# Patient Record
Sex: Male | Born: 1980 | Race: Black or African American | Hispanic: No | Marital: Married | Smoking: Current every day smoker
Health system: Southern US, Community
[De-identification: ages and names within clinical notes are randomized; demographics above are authoritative.]

## PROBLEM LIST (undated history)

## (undated) HISTORY — PX: HERNIA REPAIR: SHX51

## (undated) HISTORY — PX: TONSILLECTOMY: SUR1361

---
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
## 2000-08-11 ENCOUNTER — Emergency Department (HOSPITAL_COMMUNITY): Admission: EM | Admit: 2000-08-11 | Discharge: 2000-08-11 | Payer: Self-pay | Admitting: Emergency Medicine

## 2001-11-28 ENCOUNTER — Emergency Department (HOSPITAL_COMMUNITY): Admission: EM | Admit: 2001-11-28 | Discharge: 2001-11-28 | Payer: Self-pay | Admitting: Emergency Medicine

## 2003-09-25 ENCOUNTER — Emergency Department (HOSPITAL_COMMUNITY): Admission: EM | Admit: 2003-09-25 | Discharge: 2003-09-25 | Payer: Self-pay | Admitting: Emergency Medicine

## 2003-09-27 ENCOUNTER — Emergency Department (HOSPITAL_COMMUNITY): Admission: EM | Admit: 2003-09-27 | Discharge: 2003-09-27 | Payer: Self-pay | Admitting: *Deleted

## 2004-01-27 ENCOUNTER — Emergency Department (HOSPITAL_COMMUNITY): Admission: EM | Admit: 2004-01-27 | Discharge: 2004-01-27 | Payer: Self-pay | Admitting: Emergency Medicine

## 2004-08-09 ENCOUNTER — Emergency Department (HOSPITAL_COMMUNITY): Admission: EM | Admit: 2004-08-09 | Discharge: 2004-08-09 | Payer: Self-pay | Admitting: Family Medicine

## 2004-08-09 ENCOUNTER — Emergency Department (HOSPITAL_COMMUNITY): Admission: EM | Admit: 2004-08-09 | Discharge: 2004-08-09 | Payer: Self-pay | Admitting: Emergency Medicine

## 2005-08-20 ENCOUNTER — Emergency Department (HOSPITAL_COMMUNITY): Admission: EM | Admit: 2005-08-20 | Discharge: 2005-08-20 | Payer: Self-pay | Admitting: Family Medicine

## 2005-12-09 ENCOUNTER — Emergency Department (HOSPITAL_COMMUNITY): Admission: EM | Admit: 2005-12-09 | Discharge: 2005-12-09 | Payer: Self-pay | Admitting: Emergency Medicine

## 2006-04-04 ENCOUNTER — Emergency Department (HOSPITAL_COMMUNITY): Admission: EM | Admit: 2006-04-04 | Discharge: 2006-04-04 | Payer: Self-pay | Admitting: Family Medicine

## 2006-06-01 ENCOUNTER — Emergency Department (HOSPITAL_COMMUNITY): Admission: EM | Admit: 2006-06-01 | Discharge: 2006-06-01 | Payer: Self-pay | Admitting: Pediatrics

## 2006-06-03 ENCOUNTER — Emergency Department (HOSPITAL_COMMUNITY): Admission: EM | Admit: 2006-06-03 | Discharge: 2006-06-03 | Payer: Self-pay | Admitting: Emergency Medicine

## 2006-06-25 ENCOUNTER — Emergency Department (HOSPITAL_COMMUNITY): Admission: EM | Admit: 2006-06-25 | Discharge: 2006-06-25 | Payer: Self-pay | Admitting: Emergency Medicine

## 2006-08-03 ENCOUNTER — Emergency Department (HOSPITAL_COMMUNITY): Admission: EM | Admit: 2006-08-03 | Discharge: 2006-08-03 | Payer: Self-pay | Admitting: Family Medicine

## 2006-09-30 ENCOUNTER — Emergency Department (HOSPITAL_COMMUNITY): Admission: EM | Admit: 2006-09-30 | Discharge: 2006-09-30 | Payer: Self-pay | Admitting: Family Medicine

## 2006-10-25 ENCOUNTER — Emergency Department (HOSPITAL_COMMUNITY): Admission: EM | Admit: 2006-10-25 | Discharge: 2006-10-25 | Payer: Self-pay | Admitting: Family Medicine

## 2007-03-25 ENCOUNTER — Emergency Department (HOSPITAL_COMMUNITY): Admission: EM | Admit: 2007-03-25 | Discharge: 2007-03-25 | Payer: Self-pay | Admitting: Emergency Medicine

## 2007-04-01 ENCOUNTER — Emergency Department (HOSPITAL_COMMUNITY): Admission: EM | Admit: 2007-04-01 | Discharge: 2007-04-01 | Payer: Self-pay | Admitting: Emergency Medicine

## 2007-04-27 ENCOUNTER — Ambulatory Visit: Payer: Self-pay | Admitting: Family Medicine

## 2007-05-13 ENCOUNTER — Emergency Department (HOSPITAL_COMMUNITY): Admission: EM | Admit: 2007-05-13 | Discharge: 2007-05-14 | Payer: Self-pay | Admitting: Emergency Medicine

## 2007-06-08 ENCOUNTER — Emergency Department (HOSPITAL_COMMUNITY): Admission: EM | Admit: 2007-06-08 | Discharge: 2007-06-08 | Payer: Self-pay | Admitting: Emergency Medicine

## 2007-08-17 ENCOUNTER — Emergency Department (HOSPITAL_COMMUNITY): Admission: EM | Admit: 2007-08-17 | Discharge: 2007-08-17 | Payer: Self-pay | Admitting: Family Medicine

## 2007-12-27 ENCOUNTER — Emergency Department (HOSPITAL_COMMUNITY): Admission: EM | Admit: 2007-12-27 | Discharge: 2007-12-27 | Payer: Self-pay | Admitting: Emergency Medicine

## 2008-01-21 ENCOUNTER — Emergency Department (HOSPITAL_COMMUNITY): Admission: EM | Admit: 2008-01-21 | Discharge: 2008-01-21 | Payer: Self-pay | Admitting: Emergency Medicine

## 2008-05-17 ENCOUNTER — Emergency Department (HOSPITAL_COMMUNITY): Admission: EM | Admit: 2008-05-17 | Discharge: 2008-05-17 | Payer: Self-pay | Admitting: Emergency Medicine

## 2008-05-20 ENCOUNTER — Emergency Department (HOSPITAL_COMMUNITY): Admission: EM | Admit: 2008-05-20 | Discharge: 2008-05-20 | Payer: Self-pay | Admitting: Emergency Medicine

## 2008-05-27 ENCOUNTER — Emergency Department (HOSPITAL_COMMUNITY): Admission: EM | Admit: 2008-05-27 | Discharge: 2008-05-27 | Payer: Self-pay | Admitting: Emergency Medicine

## 2008-08-19 IMAGING — CR DG CHEST 2V
2 series · 2 of 2 positions shown · non-contrast
Comparison: none

HISTORY: Chest pain, anxiety

CHEST 2 VIEWS:
Comparison made to 12/09/2005
Normal heart size, mediastinal contours, and vascularity.
Lungs clear.
No effusion or pneumothorax.
Bones unremarkable.

[w chest pa]
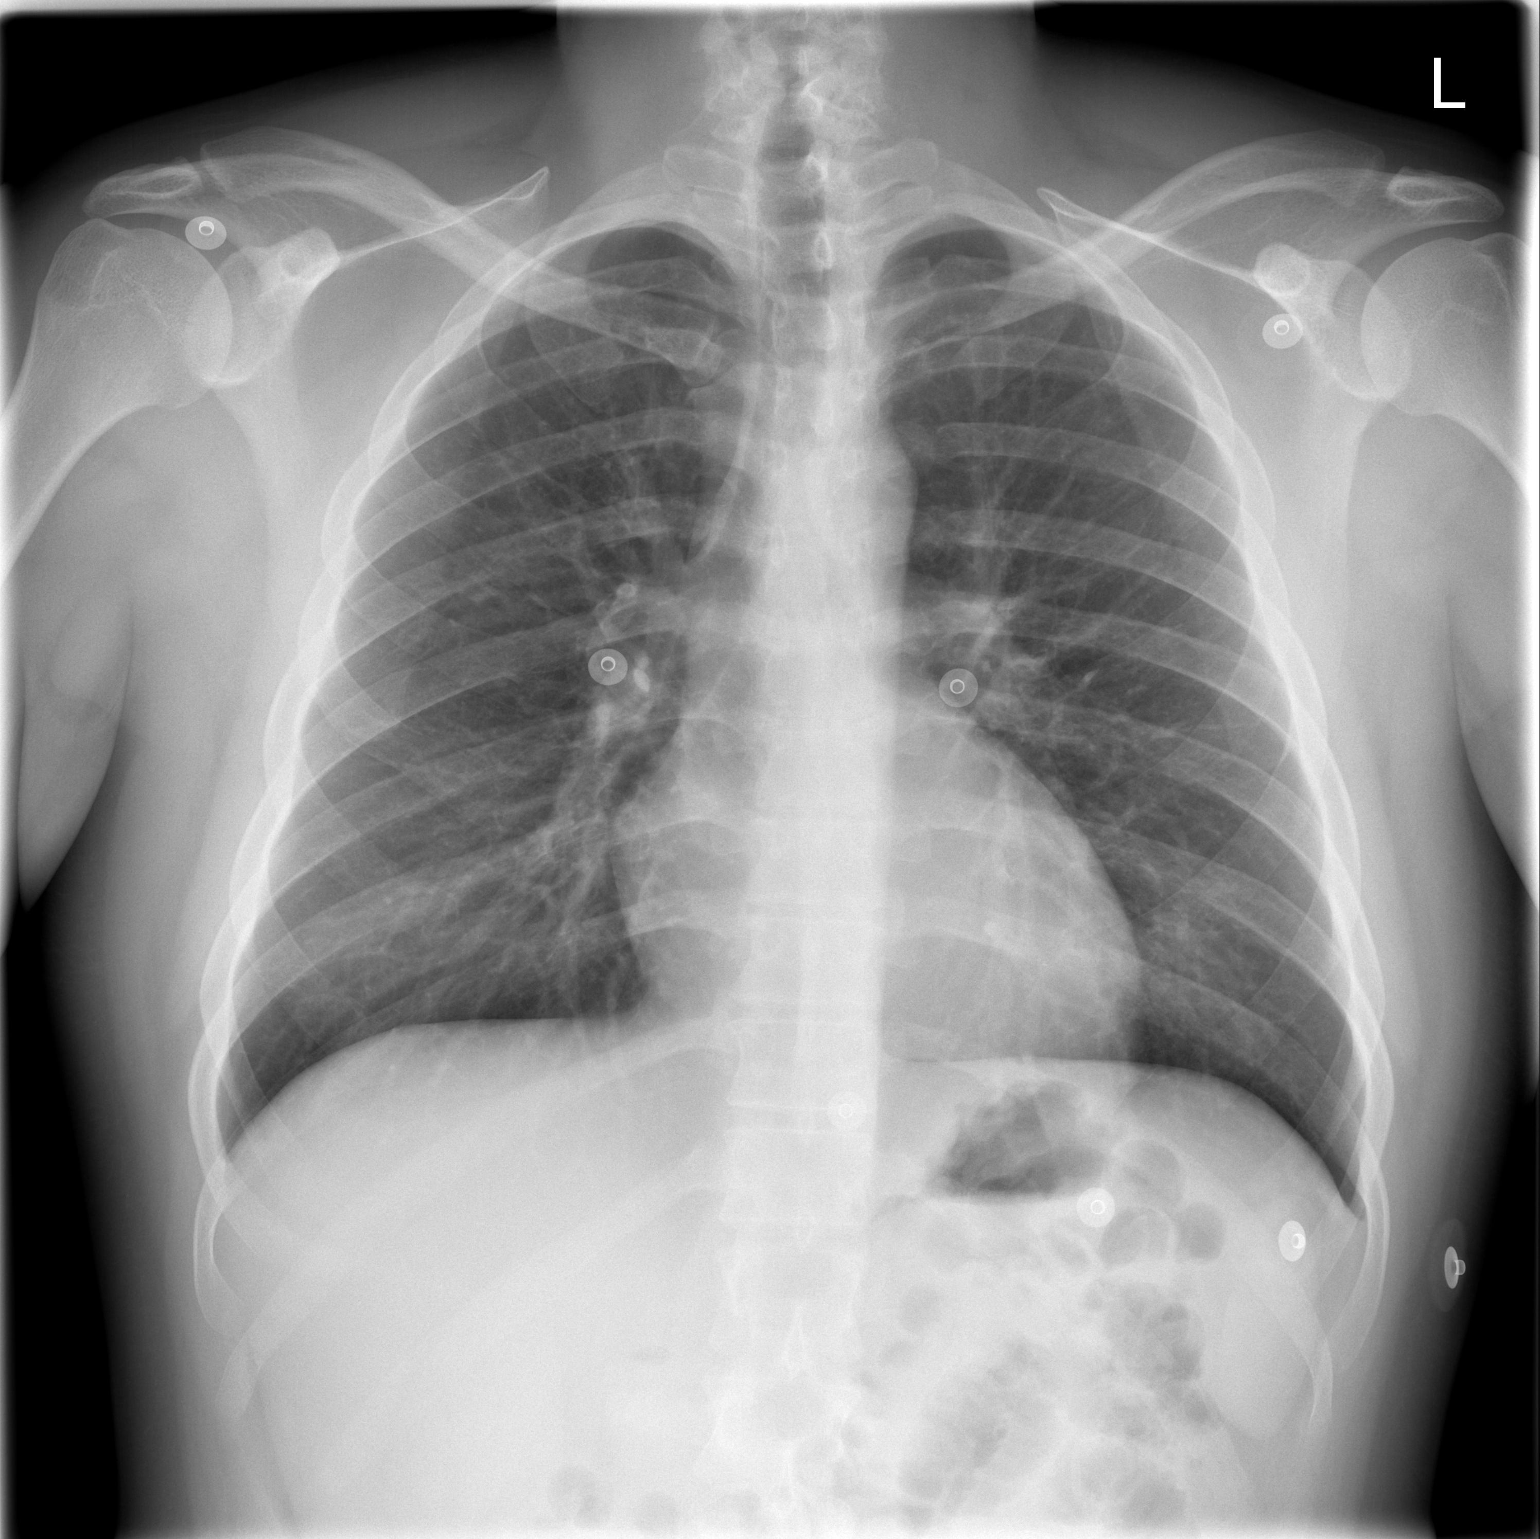

[w chest lat]
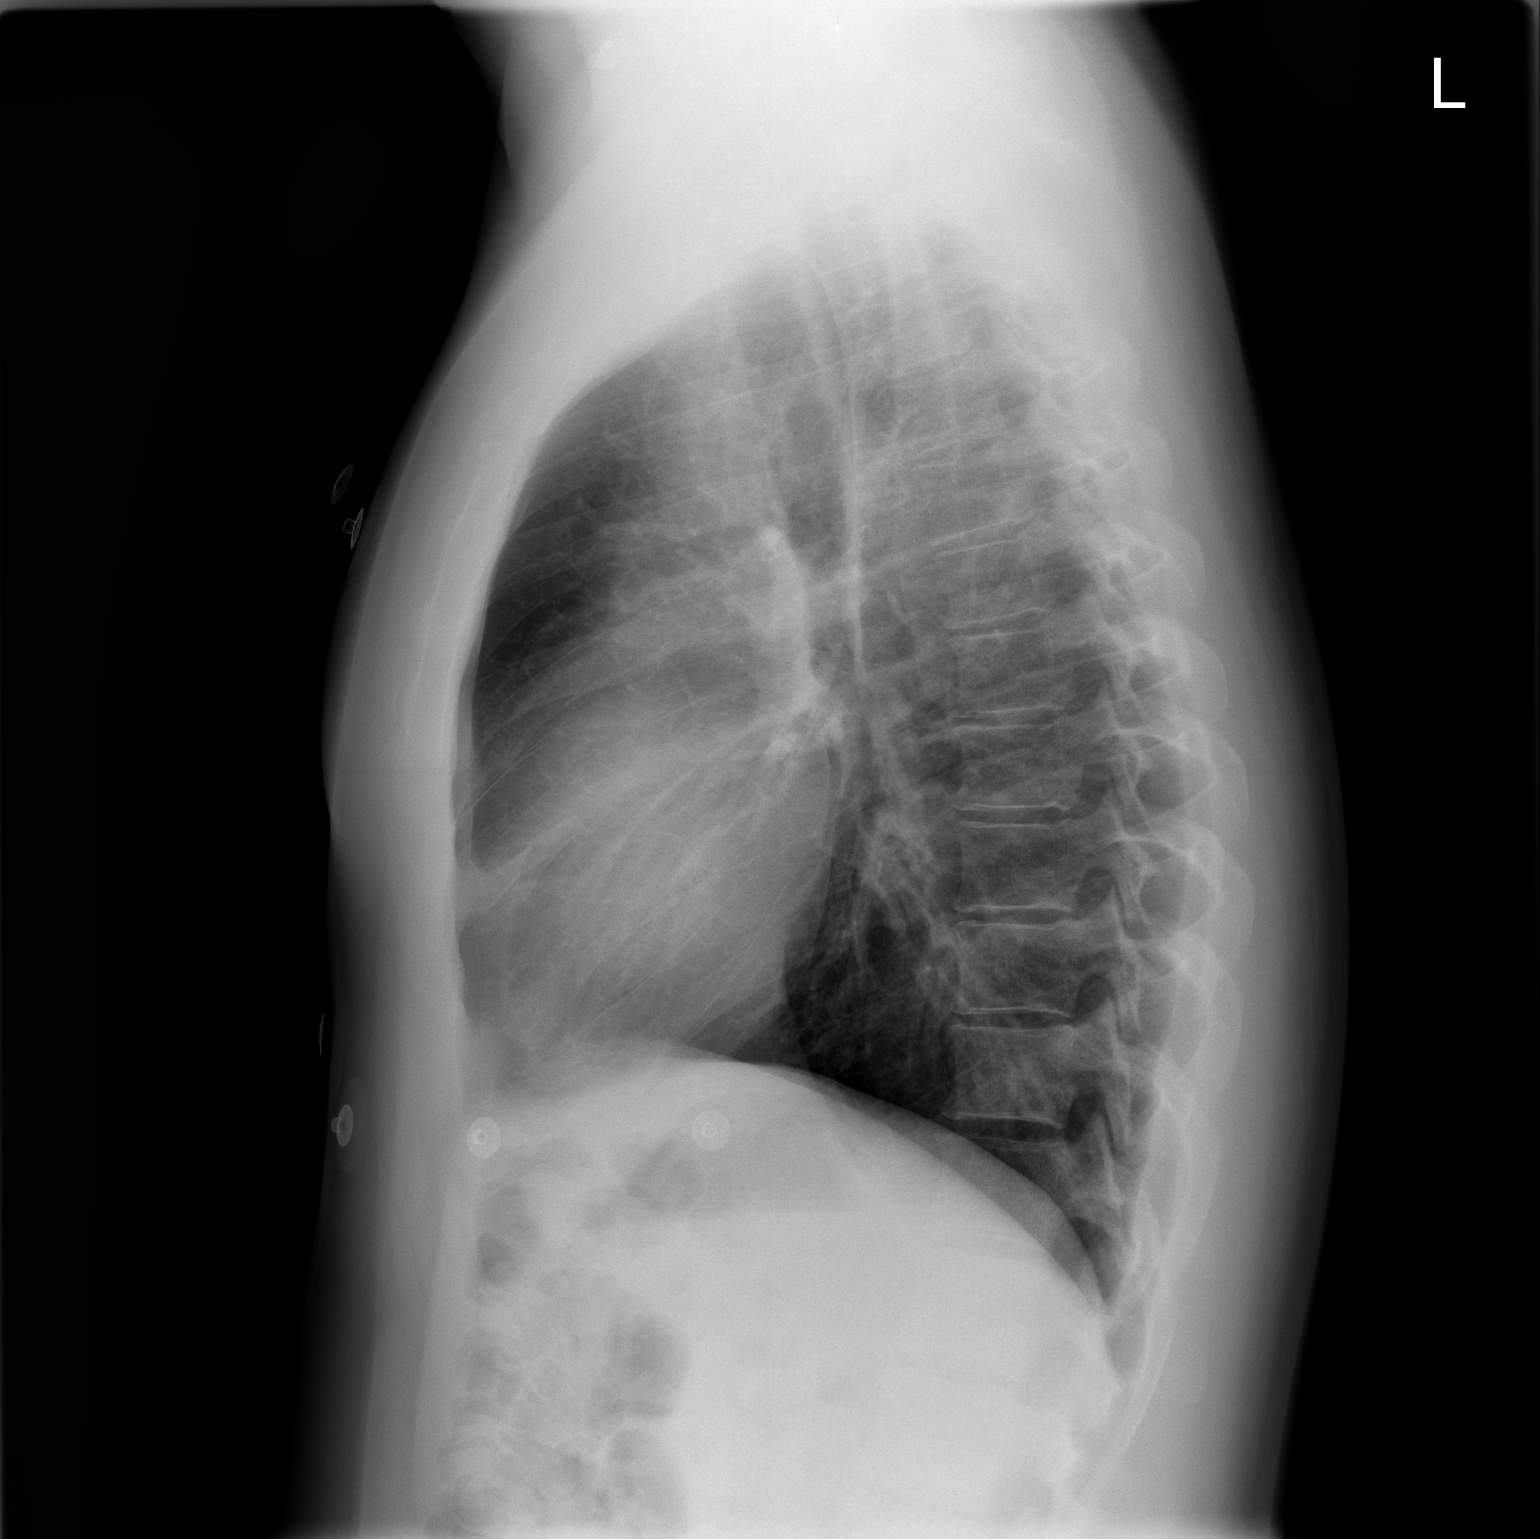

[2 of 2 positions shown; findings below may reference images not displayed]

IMPRESSION: No acute abnormalities.

## 2008-09-15 ENCOUNTER — Emergency Department (HOSPITAL_COMMUNITY): Admission: EM | Admit: 2008-09-15 | Discharge: 2008-09-15 | Payer: Self-pay | Admitting: Emergency Medicine

## 2009-08-23 IMAGING — CR DG CHEST 2V
2 series · 2 of 2 positions shown · non-contrast
Comparison: 06/08/2007

CLINICAL DATA: Vomiting and diarrhea with chest pain and shortness
of breath.

CHEST - 2 VIEW

[w chest pa]
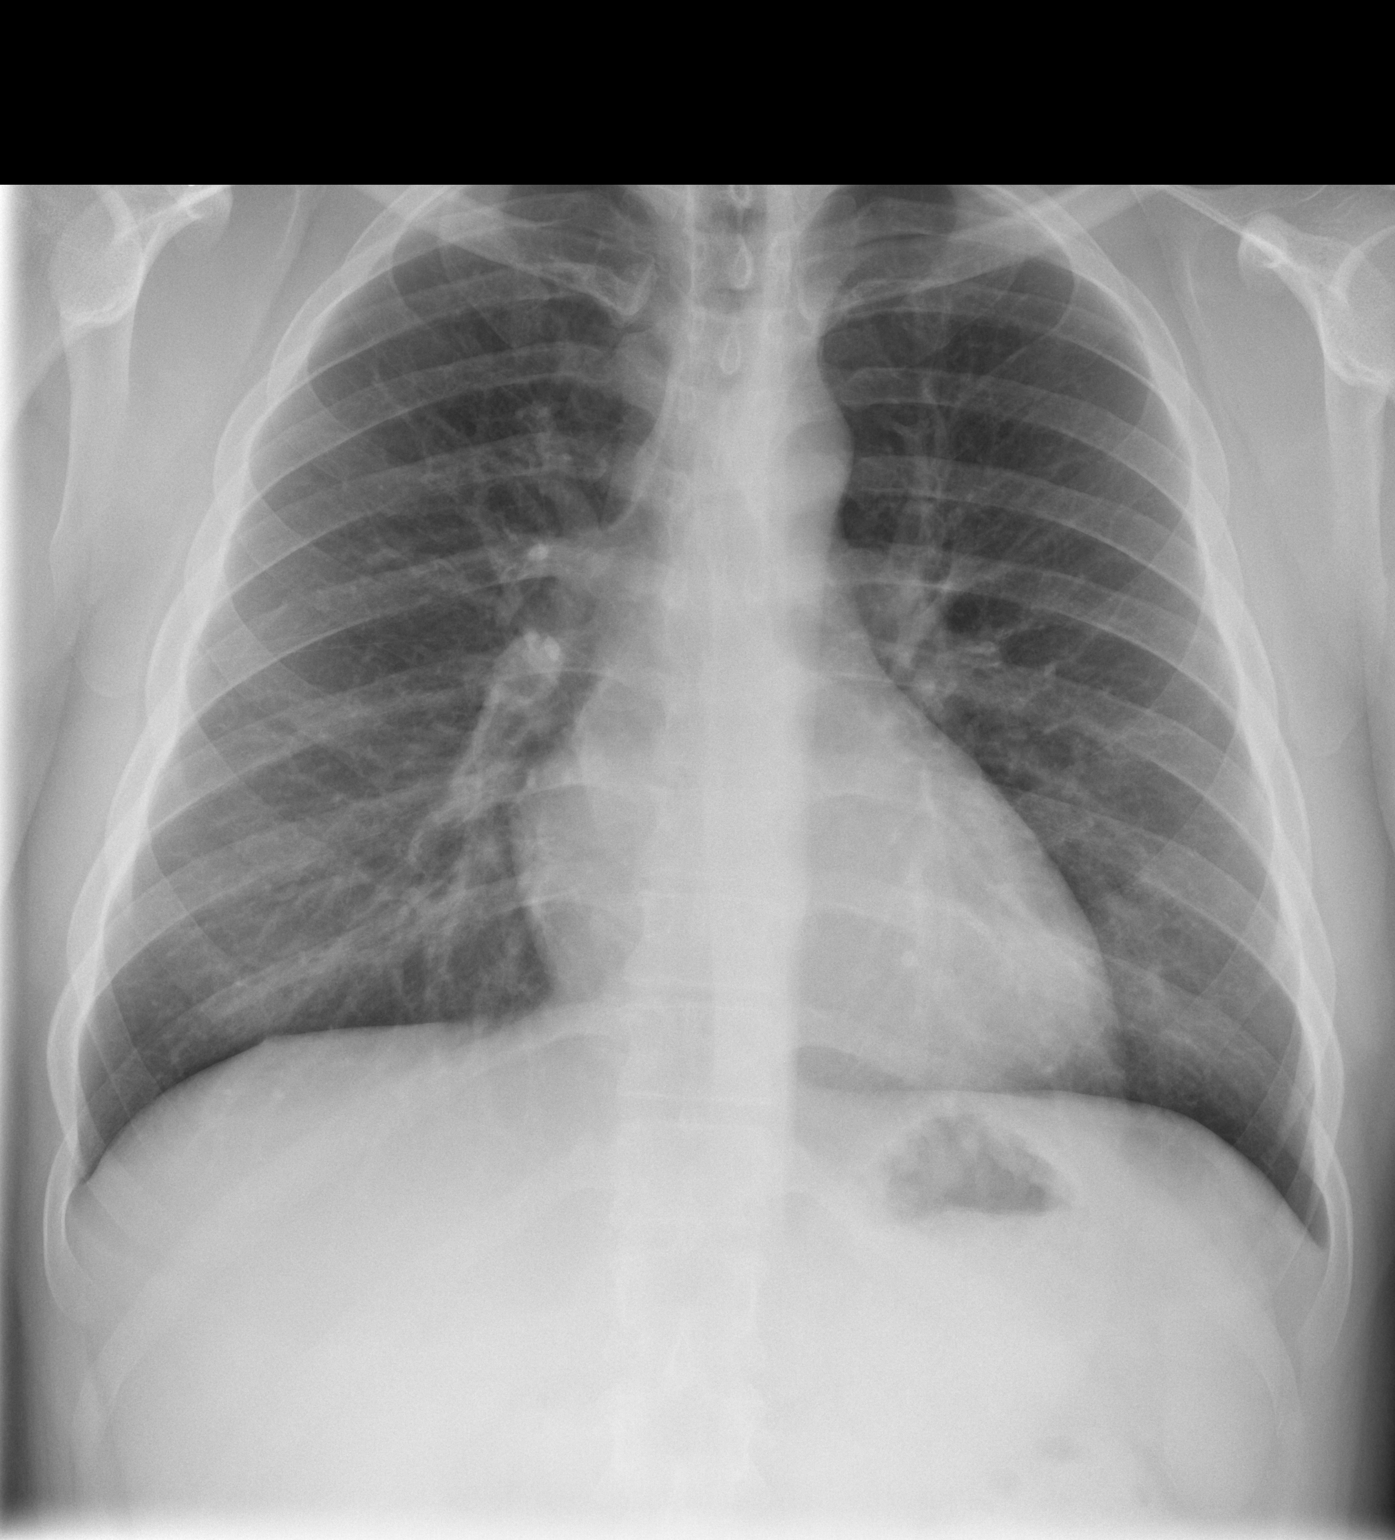

[w chest lat]
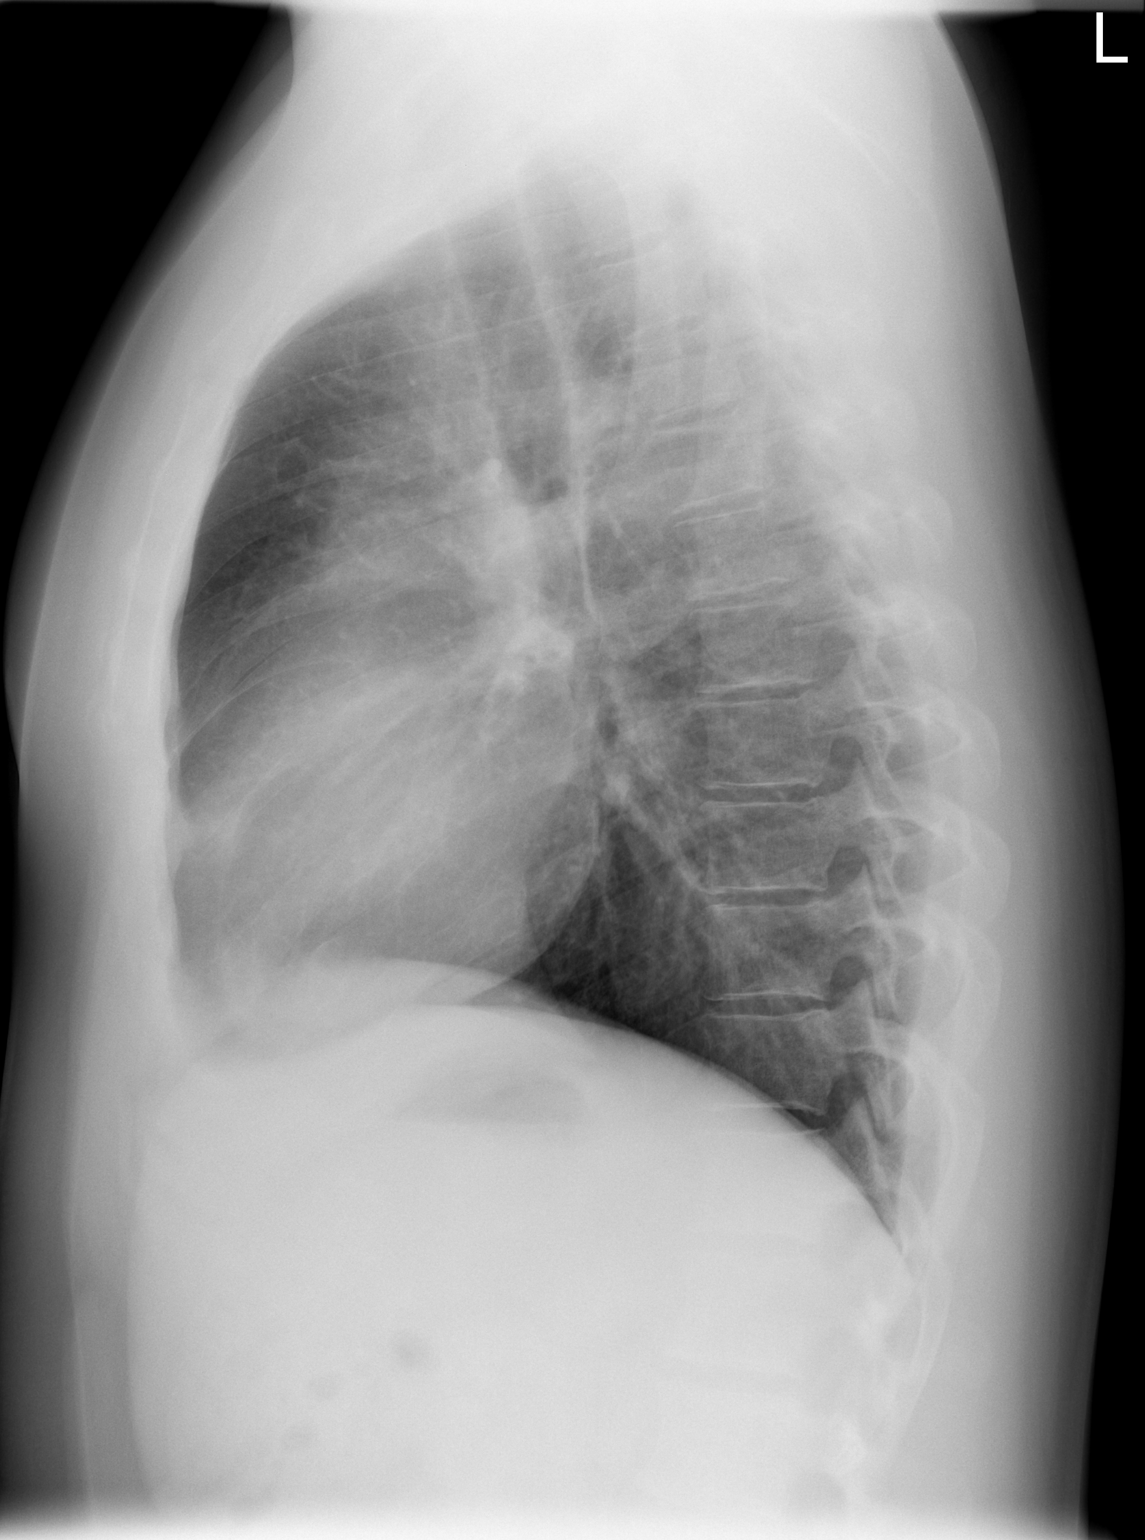

[2 of 2 positions shown; findings below may reference images not displayed]

FINDINGS: Trachea is midline.  Heart size normal.  Lungs are clear.
No pleural fluid.
IMPRESSION: No acute findings.

## 2009-08-26 IMAGING — CR DG CHEST 2V
2 series · 2 of 2 positions shown · non-contrast
Comparison: 05/17/2008

CLINICAL DATA: Abdominal pain

CHEST - 2 VIEW

[w chest pa]
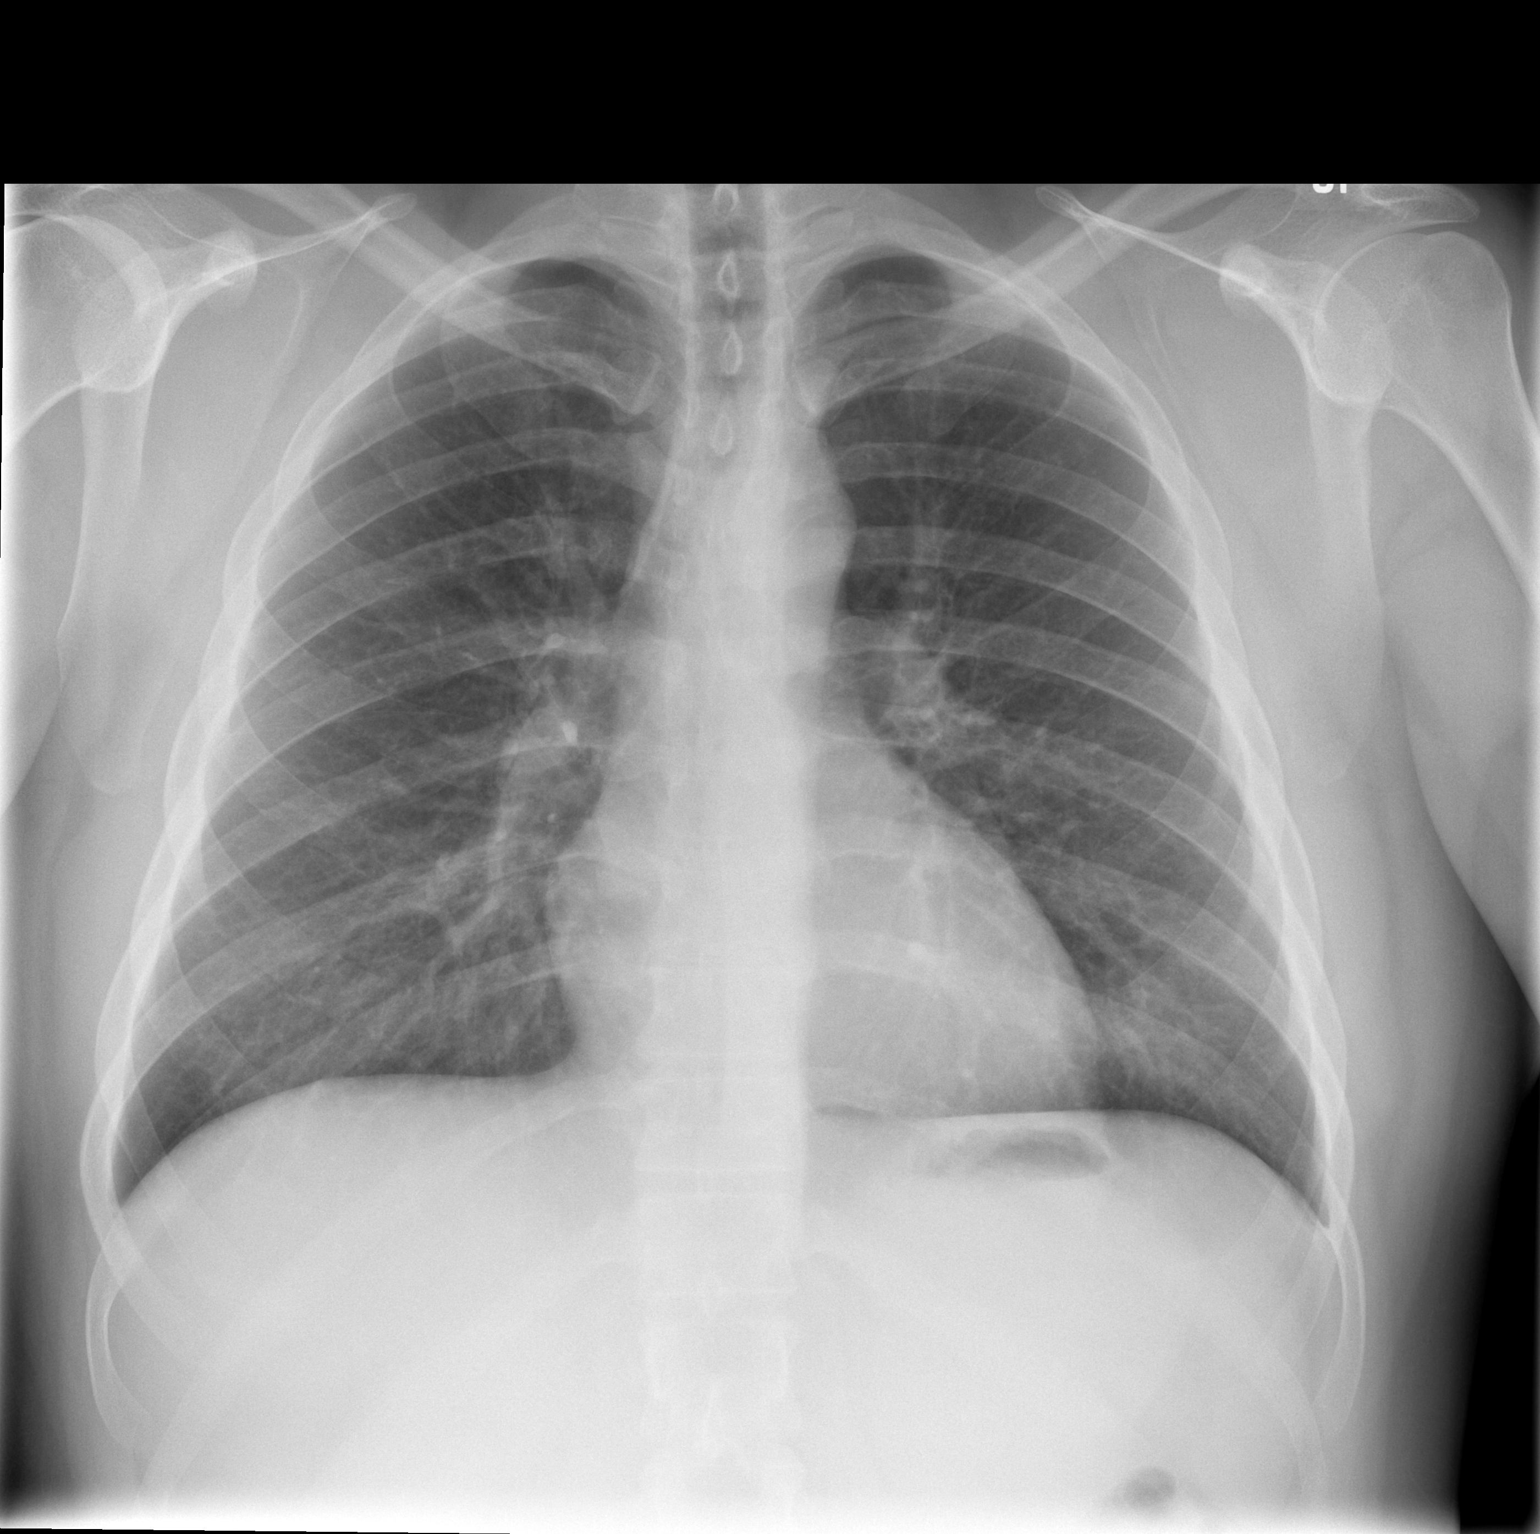

[w chest lat]
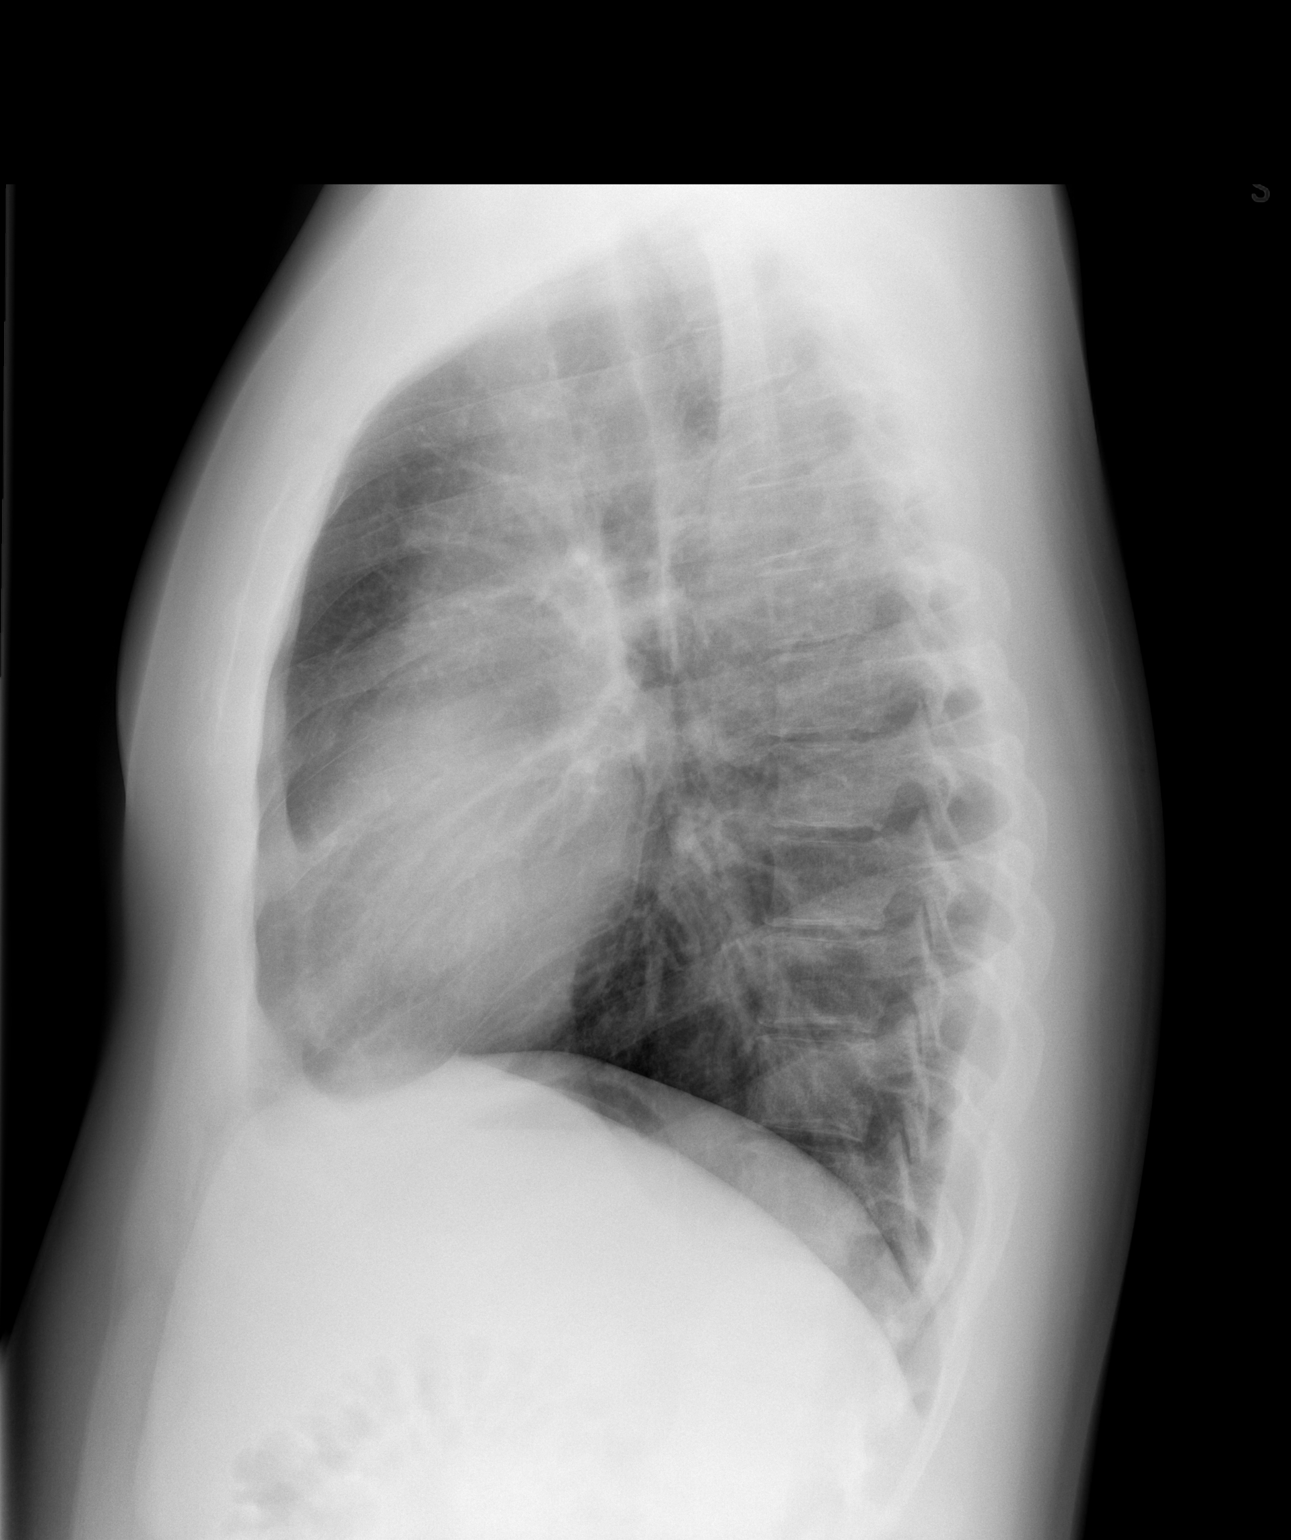

[2 of 2 positions shown; findings below may reference images not displayed]

FINDINGS: The heart size and mediastinal contours are within normal
limits.  Both lungs are clear.
IMPRESSION: No active disease.

## 2010-11-08 ENCOUNTER — Encounter: Payer: Self-pay | Admitting: Sports Medicine

## 2011-07-16 LAB — CBC
HCT: 43.2
HCT: 46.7
Hemoglobin: 14.4
Hemoglobin: 15.6
MCHC: 33.2
MCHC: 33.4
MCV: 85.7
MCV: 86.6
Platelets: 177
Platelets: 256
RBC: 5.05
RBC: 5.39
RDW: 12.5
RDW: 12.5
WBC: 13.9 — ABNORMAL HIGH
WBC: 7.8

## 2011-07-16 LAB — DIFFERENTIAL
Basophils Absolute: 0
Basophils Relative: 0
Basophils Relative: 1
Eosinophils Absolute: 0
Eosinophils Absolute: 0.1
Eosinophils Relative: 0
Lymphocytes Relative: 8 — ABNORMAL LOW
Lymphs Abs: 1.2
Monocytes Absolute: 1.2 — ABNORMAL HIGH
Monocytes Relative: 9
Neutro Abs: 11.5 — ABNORMAL HIGH
Neutro Abs: 3.8
Neutrophils Relative %: 83 — ABNORMAL HIGH
Neutrophils Relative %: 49

## 2011-07-16 LAB — POCT I-STAT, CHEM 8
BUN: 14
Calcium, Ion: 1 — ABNORMAL LOW
Calcium, Ion: 1.25
Chloride: 108
Chloride: 105
Creatinine, Ser: 1
Glucose, Bld: 112 — ABNORMAL HIGH
Glucose, Bld: 89
HCT: 44
HCT: 48
Hemoglobin: 15
Hemoglobin: 16.3
Potassium: 4
Sodium: 138
TCO2: 21
TCO2: 29

## 2011-07-16 LAB — URINALYSIS, ROUTINE W REFLEX MICROSCOPIC
Bilirubin Urine: NEGATIVE
Glucose, UA: NEGATIVE
Hgb urine dipstick: NEGATIVE
Ketones, ur: NEGATIVE
Nitrite: NEGATIVE
Nitrite: NEGATIVE
Protein, ur: NEGATIVE
Specific Gravity, Urine: 1.028
Specific Gravity, Urine: 1.028
Urobilinogen, UA: 1
pH: 5.5
pH: 5.5

## 2011-07-16 LAB — URINE MICROSCOPIC-ADD ON

## 2011-07-16 LAB — LIPASE, BLOOD: Lipase: 23

## 2011-07-16 LAB — HEPATIC FUNCTION PANEL
AST: 21
Albumin: 4.2
Total Protein: 8.3

## 2011-07-20 LAB — DIFFERENTIAL
Basophils Absolute: 0
Eosinophils Relative: 1
Lymphocytes Relative: 29
Monocytes Absolute: 0.7
Monocytes Relative: 12
Neutro Abs: 3.3

## 2011-07-20 LAB — BASIC METABOLIC PANEL
BUN: 12
Chloride: 106
Creatinine, Ser: 0.91

## 2011-07-20 LAB — CBC
MCHC: 35
MCV: 83.7
Platelets: 215
WBC: 5.7

## 2011-08-02 LAB — CBC
HCT: 41.2
MCHC: 34.3
MCV: 83.2
Platelets: 215
RDW: 12.9
WBC: 6.5

## 2011-08-02 LAB — DIFFERENTIAL
Basophils Relative: 1
Eosinophils Absolute: 0
Eosinophils Relative: 1
Lymphs Abs: 2.6
Neutrophils Relative %: 46

## 2011-08-02 LAB — I-STAT 8, (EC8 V) (CONVERTED LAB)
Acid-base deficit: 2
Bicarbonate: 21.7
HCT: 46
Operator id: 192351
TCO2: 23
pCO2, Ven: 32.1 — ABNORMAL LOW
pH, Ven: 7.436 — ABNORMAL HIGH

## 2011-08-02 LAB — POCT I-STAT CREATININE
Creatinine, Ser: 0.8
Operator id: 192351

## 2011-08-02 LAB — POCT CARDIAC MARKERS
CKMB, poc: <1 — ABNORMAL LOW
Myoglobin, poc: 43.5
Myoglobin, poc: 60.2
Operator id: 192351
Troponin i, poc: <0.05

## 2014-01-16 ENCOUNTER — Emergency Department (HOSPITAL_COMMUNITY): Payer: Self-pay

## 2014-01-16 ENCOUNTER — Emergency Department (HOSPITAL_COMMUNITY)
Admission: EM | Admit: 2014-01-16 | Discharge: 2014-01-16 | Disposition: A | Discharge status: Home / Self Care | Payer: Self-pay | Attending: Emergency Medicine | Admitting: Emergency Medicine

## 2014-01-16 ENCOUNTER — Encounter (HOSPITAL_COMMUNITY): Payer: Self-pay | Admitting: Emergency Medicine

## 2014-01-16 DIAGNOSIS — J45901 Unspecified asthma with (acute) exacerbation: Secondary | ICD-10-CM | POA: Insufficient documentation

## 2014-01-16 DIAGNOSIS — F17200 Nicotine dependence, unspecified, uncomplicated: Secondary | ICD-10-CM | POA: Insufficient documentation

## 2014-01-16 DIAGNOSIS — J9801 Acute bronchospasm: Secondary | ICD-10-CM

## 2014-01-16 DIAGNOSIS — J40 Bronchitis, not specified as acute or chronic: Secondary | ICD-10-CM

## 2014-01-16 DIAGNOSIS — R1110 Vomiting, unspecified: Secondary | ICD-10-CM | POA: Insufficient documentation

## 2014-01-16 DIAGNOSIS — R1013 Epigastric pain: Secondary | ICD-10-CM | POA: Insufficient documentation

## 2014-01-16 LAB — CBC
HCT: 41.7 % (ref 39.0–52.0)
HEMOGLOBIN: 14 g/dL (ref 13.0–17.0)
MCH: 27.9 pg (ref 26.0–34.0)
MCHC: 33.6 g/dL (ref 30.0–36.0)
MCV: 83.2 fL (ref 78.0–100.0)
PLATELETS: 234 10*3/uL (ref 150–400)
RBC: 5.01 MIL/uL (ref 4.22–5.81)
RDW: 13.4 % (ref 11.5–15.5)
WBC: 6.9 10*3/uL (ref 4.0–10.5)

## 2014-01-16 LAB — COMPREHENSIVE METABOLIC PANEL
ALK PHOS: 76 U/L (ref 39–117)
ALT: 24 U/L (ref 0–53)
AST: 21 U/L (ref 0–37)
Albumin: 4 g/dL (ref 3.5–5.2)
BUN: 13 mg/dL (ref 6–23)
CO2: 22 mEq/L (ref 19–32)
Calcium: 9.4 mg/dL (ref 8.4–10.5)
Chloride: 103 mEq/L (ref 96–112)
Creatinine, Ser: 0.66 mg/dL (ref 0.50–1.35)
GFR calc non Af Amer: >90 mL/min (ref >90)
GLUCOSE: 97 mg/dL (ref 70–99)
POTASSIUM: 4 meq/L (ref 3.7–5.3)
SODIUM: 138 meq/L (ref 137–147)
TOTAL PROTEIN: 8 g/dL (ref 6.0–8.3)
Total Bilirubin: 0.4 mg/dL (ref 0.3–1.2)

## 2014-01-16 LAB — I-STAT TROPONIN, ED: Troponin i, poc: 0 ng/mL (ref 0.00–0.08)

## 2014-01-16 MED ORDER — PREDNISONE 20 MG PO TABS
60.0000 mg | ORAL_TABLET | Freq: Once | ORAL | Status: AC
  Administered 2014-01-16: 60 mg via ORAL
  Filled 2014-01-16: qty 3

## 2014-01-16 MED ORDER — ALBUTEROL SULFATE HFA 108 (90 BASE) MCG/ACT IN AERS
2.0000 | INHALATION_SPRAY | Freq: Once | RESPIRATORY_TRACT | Status: AC
  Administered 2014-01-16: 2 via RESPIRATORY_TRACT
  Filled 2014-01-16: qty 6.7

## 2014-01-16 MED ORDER — PREDNISONE 50 MG PO TABS: ORAL_TABLET | ORAL | Status: DC

## 2014-01-16 MED ORDER — IPRATROPIUM BROMIDE 0.02 % IN SOLN
0.5000 mg | Freq: Once | RESPIRATORY_TRACT | Status: AC
  Administered 2014-01-16: 0.5 mg via RESPIRATORY_TRACT
  Filled 2014-01-16: qty 2.5

## 2014-01-16 MED ORDER — ALBUTEROL SULFATE (2.5 MG/3ML) 0.083% IN NEBU
5.0000 mg | INHALATION_SOLUTION | Freq: Once | RESPIRATORY_TRACT | Status: AC
  Administered 2014-01-16: 5 mg via RESPIRATORY_TRACT
  Filled 2014-01-16: qty 6

## 2014-01-16 NOTE — Discharge Instructions (Signed)
Take prednisone as prescribed for the next 5 days beginning tomorrow as you were given the first dose in the emergency department today. Follow up with the Wellness clinic or one of the resources below to establish care with a primary care doctor.  RESOURCE GUIDE  Chronic Pain Problems: Contact [facility]  [phone] Patients need to be referred by their primary care doctor.  Insufficient Money for Medicine: Contact [employer]:  call "211."   No Primary Care Doctor: - Call [facility]  [phone] - can help you locate a primary care doctor that  accepts your insurance, provides certain services, etc. - Physician Referral Service- [phone]  Agencies that provide inexpensive medical care: - [facility]  [phone] - [facility]  [phone] - [facility]  [phone] - [facility] Clinic  [phone] - [facility]  [phone] - [facility]  [phone]  Medicaid-accepting [facility] Providers: - [facility]- [address]  [phone], Mon-Fri 9am-7pm, Sat 9am-1pm - [facility]- [address] [place], [address] [place]  [phone] - [facility]- [address], [address] [place]  [phone] [facility]- [address]  [phone] - Renaye Rakers- [address] [place], [address], [phone]  Only accepts [place] patients after they have their name  applied to their card  Self Pay (no insurance) in [place]: - Sickle Cell Patients: Dr Willey Blade, [facility]  [address] [place], [phone] - [facility]- [address] [place]  [phone]       [facility] [place]- [address]       -     [facility]- see information above (Speak to [employer] if you do not have insurance)       -  [facility]- [address],  [phone]       -  [facility]- [address], [phone]       -  Dr Julio Sicks-  [address], [place], [phone]       -  [facility] - [address], [phone]       -  [facility]- [address], [phone], also [address], [phone]       -    [facility]- [address] [place], [phone], 1st & 3rd Saturday        every month, 10am-1pm  1) Find a Doctor and Pay Out of Pocket Although you won't have to find out who is covered by your insurance plan, it is a good idea to ask around and get recommendations. You will then need to call the office and see if the doctor you have chosen will accept you as a new patient and what types of options they offer for patients who are self-pay. Some doctors offer discounts or will set up payment plans for their patients who do not have insurance, but you will need to ask so you aren't surprised when you get to your appointment.  2) Contact Your Local Health Department Not all health departments have doctors that can see patients for sick visits, but many do, so it is worth a call to see if yours does. If you don't know where your local  health department is, you can check in your phone book. The CDC also has a tool to help you locate your state's health department, and many state websites also have listings of all of their local health departments.  3) Find a Walk-in Clinic If your illness is not likely to be very severe or complicated, you may want to try a walk in clinic. These are popping up all over the country in pharmacies, drugstores, and shopping centers. They're usually staffed by nurse practitioners or physician assistants that have been trained to treat common illnesses and complaints. They're usually fairly quick and inexpensive. However, if you have serious medical issues or chronic medical problems, these are probably not your best option  Bronchitis Bronchitis is inflammation of  the airways that extend from the windpipe into the lungs (bronchi). The inflammation often causes mucus to develop, which leads to a cough. If the inflammation becomes severe, it may cause shortness of breath. CAUSES  Bronchitis may be caused by:   Viral infections.   Bacteria.   Cigarette smoke.   Allergens, pollutants, and other irritants.  SIGNS AND SYMPTOMS  The most common symptom of bronchitis is a frequent cough that produces mucus. Other symptoms include:  Fever.   Body aches.   Chest congestion.   Chills.   Shortness of breath.   Sore throat.  DIAGNOSIS  Bronchitis is usually diagnosed through a medical history and physical exam. Tests, such as chest X-rays, are sometimes done to rule out other conditions.  TREATMENT  You may need to avoid contact with whatever caused the problem (smoking, for example). Medicines are sometimes needed. These may include:  Antibiotics. These may be prescribed if the condition is caused by bacteria.  Cough suppressants. These may be prescribed for relief of cough symptoms.   Inhaled medicines. These may be prescribed to help open your airways and make it easier for you to breathe.   Steroid medicines. These may be prescribed for those with recurrent (chronic) bronchitis. HOME CARE INSTRUCTIONS  Get plenty of rest.   Drink enough fluids to keep your urine clear or pale yellow (unless you have a medical condition that requires fluid restriction). Increasing fluids may help thin your secretions and will prevent dehydration.   Only take over-the-counter or prescription medicines as directed by your health care provider.  Only take antibiotics as directed. Make sure you finish them even if you start to feel better.  Avoid secondhand smoke, irritating chemicals, and strong fumes. These will make bronchitis worse. If you are a smoker, quit smoking. Consider using nicotine gum or skin patches to help control withdrawal  symptoms. Quitting smoking will help your lungs heal faster.   Put a cool-mist humidifier in your bedroom at night to moisten the air. This may help loosen mucus. Change the water in the humidifier daily. You can also run the hot water in your shower and sit in the bathroom with the door closed for 5 10 minutes.   Follow up with your health care provider as directed.   Wash your hands frequently to avoid catching bronchitis again or spreading an infection to others.  SEEK MEDICAL CARE IF: Your symptoms do not improve after 1 week of treatment.  SEEK IMMEDIATE MEDICAL CARE IF:  Your fever increases.  You have chills.   You have chest pain.   You have worsening shortness of breath.   You have bloody sputum.  You faint.  You have lightheadedness.  You have a severe headache.  You vomit repeatedly. MAKE SURE YOU:   Understand these instructions.  Will watch your condition.  Will get help right away if you are not doing well or get worse. Document Released: 10/04/2005 Document Revised: 07/25/2013 Document Reviewed: 05/29/2013 [facility] Patient Information 2014 [place].  Bronchospasm, Adult A bronchospasm is a spasm or tightening of the airways going into the lungs. During a bronchospasm breathing becomes more difficult because the airways get smaller. When this happens there can be coughing, a whistling sound when breathing (wheezing), and difficulty breathing. Bronchospasm is often associated with asthma, but not all patients who experience a bronchospasm have asthma. CAUSES  A bronchospasm is caused by inflammation or irritation of the airways. The inflammation or irritation may be triggered by:   Allergies (such as to animals, pollen, food, or mold). Allergens that cause bronchospasm may cause wheezing immediately after exposure or many hours later.   Infection. Viral infections are believed to be the most common cause of bronchospasm.   Exercise.    Irritants (such as pollution, cigarette smoke, strong odors, aerosol sprays, and paint fumes).   Weather changes. Winds increase molds and pollens in the air. Rain refreshes the air by washing irritants out. Cold air may cause inflammation.   Stress and emotional upset.  SIGNS AND SYMPTOMS   Wheezing.   Excessive nighttime coughing.   Frequent or severe coughing with a simple cold.   Chest tightness.   Shortness of breath.  DIAGNOSIS  Bronchospasm is usually diagnosed through a history and physical exam. Tests, such as chest X-rays, are sometimes done to look for other conditions. TREATMENT   Inhaled medicines can be given to open up your airways and help you breathe. The medicines can be given using either an inhaler or a nebulizer machine.  Corticosteroid medicines may be given for severe bronchospasm, usually when it is associated with asthma. HOME CARE INSTRUCTIONS   Always have a plan prepared for seeking medical care. Know when to call your health care provider and local emergency services (911 in the [place] Know where you can access local emergency care.  Only take medicines as directed by your health care provider.  If you were prescribed an inhaler or nebulizer machine, ask your health care provider to explain how to use it correctly. Always use a spacer with your inhaler if you were given one.  It is necessary to remain calm during an attack. Try to relax and breathe more slowly.  Control your home environment in the following ways:   Change your heating and air conditioning filter at least once a month.   Limit your use of fireplaces and wood stoves.  Do not smoke and do not allow smoking in your home.   Avoid exposure to perfumes and fragrances.   Get rid of pests (such as roaches and mice) and their droppings.   Throw away plants if you see mold on them.   Keep your house clean and dust free.   Replace carpet with wood, tile, or vinyl  flooring. Carpet can trap dander and dust.   Use allergy-proof pillows, mattress covers, and box spring covers.   Wash bed sheets and blankets every week in hot water and dry them in a dryer.   Use blankets that are made of polyester or cotton.   Wash hands frequently. SEEK MEDICAL CARE IF:   You have muscle aches.   You have chest pain.   The sputum changes from clear or white to yellow, green, gray, or  bloody.   The sputum you cough up gets thicker.   There are problems that may be related to the medicine you are given, such as a rash, itching, swelling, or trouble breathing.  SEEK IMMEDIATE MEDICAL CARE IF:   You have worsening wheezing and coughing even after taking your prescribed medicines.   You have increased difficulty breathing.   You develop severe chest pain. MAKE SURE YOU:   Understand these instructions.  Will watch your condition.  Will get help right away if you are not doing well or get worse. Document Released: 10/07/2003 Document Revised: 06/06/2013 Document Reviewed: 03/26/2013 [facility] Patient Information 2014 [place], [place].

## 2014-01-16 NOTE — ED Provider Notes (Signed)
CSN: [id number]     Arrival date & time 01/16/14  1238 History   First MD Initiated Contact with Patient 01/16/14 1255     Chief Complaint  Patient presents with  . Pleurisy     (Consider location/radiation/quality/duration/timing/severity/associated sxs/prior Treatment) HPI Comments: 33 y/o male with no significant PMHx presents to the ED with his wife complaining of chest pain/tightness, cough and shortness of breath x 3 days. Pt states he has had these symptoms "for a while, a few months" worsening 3 days ago. Pain located in the center of his chest described as a tightness, worse with palpation of chest or deep inspiration. Admits to associated vomiting causing pain to his upper abdomen. Shortness of breath is both at rest and on exertion. Wife states at night he will cough and wake up short of breath, and when she lays on his chest she can hear "rattling". Had asthma as a child. Smokes 1 ppd. Denies edema. No hx of blood clots.   The history is provided by the patient and the spouse.    History reviewed. No pertinent past medical history. Past Surgical History  Procedure Laterality Date  . Hernia repair    . Tonsillectomy     History reviewed. No pertinent family history. History  Substance Use Topics  . Smoking status: Current Every Day Smoker -- 1.00 packs/day    Types: Cigarettes  . Smokeless tobacco: Not on file  . Alcohol Use: Yes     Comment: social    Review of Systems  Respiratory: Positive for cough, chest tightness, shortness of breath and wheezing.   Cardiovascular: Positive for chest pain.  Gastrointestinal: Positive for vomiting and abdominal pain.  All other systems reviewed and are negative.      Allergies  Review of patient's allergies indicates no known allergies.  Home Medications   Current Outpatient Rx  Name  Route  Sig  Dispense  Refill  . Acetaminophen (TYLENOL PO)   Oral   Take 1 tablet by mouth daily as needed (pain.).         Marland Kitchen  ibuprofen (ADVIL,MOTRIN) 200 MG tablet   Oral   Take 200 mg by mouth every 6 (six) hours as needed for mild pain.         . Sennosides (EX-LAX PO)   Oral   Take by mouth as needed (constipation.).         Marland Kitchen predniSONE (DELTASONE) 50 MG tablet      2 tabs po daily x 3 days   5 tablet   0    BP 116/75  Pulse 81  Temp(Src) 97.8 F (36.6 C) (Oral)  Resp 19  SpO2 94% Physical Exam  Nursing note and vitals reviewed. Constitutional: He is oriented to person, place, and time. He appears well-developed and well-nourished. No distress.  HENT:  Head: Normocephalic and atraumatic.  Mouth/Throat: Oropharynx is clear and moist.  Eyes: Conjunctivae and EOM are normal. Pupils are equal, round, and reactive to light.  Neck: Normal range of motion. Neck supple. No JVD present.  Cardiovascular: Normal rate, regular rhythm, normal heart sounds and intact distal pulses.   No extremity edema.  Pulmonary/Chest: Effort normal. No respiratory distress. He exhibits tenderness.    Scattered inspiratory/expiratory wheezes and ronchi bilatera.  Abdominal: Soft. Normal appearance and bowel sounds are normal. He exhibits no distension. There is tenderness in the epigastric area. There is no rigidity, no rebound and no guarding.  Musculoskeletal: Normal range of motion. He exhibits no  edema.  Neurological: He is alert and oriented to person, place, and time. He has normal strength. No sensory deficit.  Speech fluent, goal oriented. Moves limbs without ataxia. Equal grip strength bilateral.  Skin: Skin is warm and dry. He is not diaphoretic.  Psychiatric: He has a normal mood and affect. His behavior is normal.    ED Course  Procedures (including critical care time) Labs Review Labs Reviewed  CBC  COMPREHENSIVE METABOLIC PANEL  Rosezena SensorI-STAT TROPOININ, ED   Imaging Review Dg Chest 2 View  01/16/2014   CLINICAL DATA:  Shortness of breath/pleurisy  EXAM: CHEST  2 VIEW  COMPARISON:  May 20, 2008   FINDINGS: Lungs are clear. Heart size and pulmonary vascularity are normal. No adenopathy. No pneumothorax. No bone lesions.  IMPRESSION: No abnormality noted.   Electronically Signed   By: Bretta BangWilliam  Woodruff M.D.   On: 01/16/2014 13:54     EKG Interpretation   Date/Time:  Wednesday January 16 2014 12:49:05 EDT Ventricular Rate:  78 PR Interval:  188 QRS Duration: 114 QT Interval:  391 QTC Calculation: 445 R Axis:   83 Text Interpretation:  Sinus rhythm Borderline intraventricular conduction  delay No significant change since last tracing Confirmed by KNAPP  MD-J,  JON ([id number]) on 01/16/2014 1:07:51 PM      MDM   Final diagnoses:  Bronchitis  Bronchospasm   Presentation of acute asthma/bronchitis. Pt well appearing and in NAD, afebrile, normal VS. Harsh cough present, lungs with significant ronchi and wheezes throughout. CXR, labs pending. Doubt cardiac, low risk other than smoking. PERC negative, low suspicion for PE. Will give DuoNeb and prednisone. 2:36 PM CXR clear. Labs normal. Troponin negative. EKG without significant changes. Pt reports great improvement after prednisone and DuoNeb. On repeat exam, lungs with significant improvement. O2 sat improved from 94% to 97% on RA. He is stable for d/c, will tx with short course of prednisone, albuterol inhaler given. Resources given for PCP f/u. Return precautions given. Patient states understanding of treatment care plan and is agreeable.   Trevor MaceRobyn M Albert, PA-C 01/16/14 1440

## 2014-01-16 NOTE — ED Notes (Signed)
Per pt, woke up with chest discomfort x 2 days ago. Pain in center of chest, upper abdomen.  Pt states he has been vomiting, short of breath and bm have not been normal.  No fever.

## 2014-01-17 NOTE — ED Provider Notes (Signed)
Medical screening examination/treatment/procedure(s) were performed by non-physician practitioner and as supervising physician I was immediately available for consultation/collaboration.   EKG Interpretation   Date/Time:  Wednesday January 16 2014 12:49:05 EDT Ventricular Rate:  78 PR Interval:  188 QRS Duration: 114 QT Interval:  391 QTC Calculation: 445 R Axis:   83 Text Interpretation:  Sinus rhythm Borderline intraventricular conduction  delay No significant change since last tracing Confirmed by Providencia Hottenstein  Bradley Moreno-J,  Jazman Reuter ([id number]) on 01/16/2014 1:07:51 PM        Bradley KrasJon R Kjersten Ormiston, Bradley Moreno 01/17/14 [phone]

## 2014-05-23 ENCOUNTER — Encounter (HOSPITAL_COMMUNITY): Payer: Self-pay | Admitting: Emergency Medicine

## 2014-05-23 ENCOUNTER — Emergency Department (HOSPITAL_COMMUNITY)
Admission: EM | Admit: 2014-05-23 | Discharge: 2014-05-23 | Disposition: A | Discharge status: Home / Self Care | Payer: Self-pay | Attending: Emergency Medicine | Admitting: Emergency Medicine

## 2014-05-23 ENCOUNTER — Emergency Department (HOSPITAL_COMMUNITY): Payer: Self-pay

## 2014-05-23 DIAGNOSIS — M659 Unspecified synovitis and tenosynovitis, unspecified site: Secondary | ICD-10-CM | POA: Insufficient documentation

## 2014-05-23 DIAGNOSIS — Z791 Long term (current) use of non-steroidal anti-inflammatories (NSAID): Secondary | ICD-10-CM | POA: Insufficient documentation

## 2014-05-23 DIAGNOSIS — F17200 Nicotine dependence, unspecified, uncomplicated: Secondary | ICD-10-CM | POA: Insufficient documentation

## 2014-05-23 DIAGNOSIS — M779 Enthesopathy, unspecified: Secondary | ICD-10-CM

## 2014-05-23 DIAGNOSIS — M25539 Pain in unspecified wrist: Secondary | ICD-10-CM | POA: Insufficient documentation

## 2014-05-23 DIAGNOSIS — R059 Cough, unspecified: Secondary | ICD-10-CM | POA: Insufficient documentation

## 2014-05-23 DIAGNOSIS — R05 Cough: Secondary | ICD-10-CM | POA: Insufficient documentation

## 2014-05-23 DIAGNOSIS — Z880 Allergy status to penicillin: Secondary | ICD-10-CM | POA: Insufficient documentation

## 2014-05-23 DIAGNOSIS — R062 Wheezing: Secondary | ICD-10-CM | POA: Insufficient documentation

## 2014-05-23 MED ORDER — NAPROXEN 500 MG PO TABS
500.0000 mg | ORAL_TABLET | Freq: Once | ORAL | Status: AC
  Administered 2014-05-23: 500 mg via ORAL
  Filled 2014-05-23: qty 1

## 2014-05-23 MED ORDER — NAPROXEN 500 MG PO TABS: 500.0000 mg | ORAL_TABLET | Freq: Two times a day (BID) | ORAL | Status: DC

## 2014-05-23 NOTE — ED Provider Notes (Signed)
CSN: [id number]     Arrival date & time 05/23/14  1232 History   First MD Initiated Contact with Patient 05/23/14 1302    This chart was scribed for non-physician practitioner, Junious Silk, working with Rolland Porter, MD by Marica Otter, ED Scribe. This patient was seen in room WTR9/WTR9 and the patient's care was started at 1:31 PM.  No chief complaint on file.  The history is provided by the patient. No language interpreter was used.   HPI Comments: Bradley Moreno is a 33 y.o. male who presents to the Emergency Department complaining of right wrist pain onset 2 days ago. Pt states the pain is made worse with movement and rates his current pain an 8 out of 10. Pt notes that he works in [employer]; and while at work yesterday, he was unable to flip over a fry pan due to "excrutiating" pain in his right hand. Pt denies any recent falls or injury to the area.   Pt is a current everyday smoker who smokes 1 ppd.   History reviewed. No pertinent past medical history. Past Surgical History  Procedure Laterality Date  . Hernia repair    . Tonsillectomy     No family history on file. History  Substance Use Topics  . Smoking status: Current Every Day Smoker -- 1.00 packs/day    Types: Cigarettes  . Smokeless tobacco: Not on file  . Alcohol Use: Yes     Comment: social    Review of Systems  Constitutional: Negative for fever and chills.  Respiratory: Positive for cough (from smoking ) and wheezing (at baseline from smoking). Negative for shortness of breath.   Musculoskeletal:       Right wrist pain  Psychiatric/Behavioral: Negative for confusion.  All other systems reviewed and are negative.     Allergies  Bee venom and Penicillins  Home Medications   Prior to Admission medications   Medication Sig Start Date End Date Taking? Authorizing Provider  ibuprofen (ADVIL,MOTRIN) 200 MG tablet Take 600 mg by mouth every 6 (six) hours as needed for mild pain or moderate pain.    Yes  Historical Provider, MD   Triage Vitals: BP 116/82  Pulse 72  Temp(Src) 98.3 F (36.8 C) (Oral)  Resp 16  SpO2 98% Physical Exam  Nursing note and vitals reviewed. Constitutional: He is oriented to person, place, and time. He appears well-developed and well-nourished. No distress.  HENT:  Head: Normocephalic and atraumatic.  Right Ear: External ear normal.  Left Ear: External ear normal.  Nose: Nose normal.  Eyes: Conjunctivae are normal.  Neck: Normal range of motion. No tracheal deviation present.  Cardiovascular: Normal rate, regular rhythm and normal heart sounds.   Pulmonary/Chest: Effort normal. No stridor. He has wheezes (pt states this is chronic and no SOB).  Abdominal: Soft. He exhibits no distension. There is no tenderness.  Musculoskeletal: Normal range of motion.  Finkelstein's test positive for right hand.  Tender to palpation over base of right thumb and throughout radial aspect of right forearm. No swelling, bruising, erythema. Sensation intact. Compartment soft.   Neurological: He is alert and oriented to person, place, and time.  Skin: Skin is warm and dry. He is not diaphoretic.  Psychiatric: He has a normal mood and affect. His behavior is normal.    ED Course  Procedures (including critical care time) DIAGNOSTIC STUDIES: Oxygen Saturation is 98% on RA, nl by my interpretation.    COORDINATION OF CARE: 1:33 PM-Discussed treatment  plan which includes discussing imaging results, splint placement, referral to hand surgery, work note and anti-inflammatory meds with pt at bedside and pt agreed to plan. Pt further advised to quit smoking.   Labs Review Labs Reviewed - No data to display  Imaging Review Dg Wrist Complete Right  05/23/2014   CLINICAL DATA:  Wrist pain.  No recent injury.  EXAM: RIGHT WRIST - COMPLETE 3+ VIEW  COMPARISON:  None.  FINDINGS: There is no evidence of fracture or dislocation. There is no evidence of arthropathy or other focal bone  abnormality. Soft tissues are unremarkable.  IMPRESSION: Negative.   Electronically Signed   By: Amie Portlandavid  Ormond M.D.   On: 05/23/2014 13:27     EKG Interpretation None      MDM   Final diagnoses:  Tendonitis   Patient presents to ED with sx consistent with tendonitis. Given wrist splint. XR normal. Sensation intact. Compartment soft. Return instructions given. Vital signs stable for discharge.Patient / Family / Caregiver informed of clinical course, understand medical decision-making process, and agree with plan.   I personally performed the services described in this documentation, which was scribed in my presence. The recorded information has been reviewed and is accurate.     Mora BellmanHannah S Geron Mulford, PA-C 05/23/14 2053

## 2014-05-23 NOTE — Discharge Instructions (Signed)
Tendinitis ° Tendinitis is redness, soreness, and puffiness (inflammation) of the tendons. Tendons are band-like tissues that connect muscle to bone. Tendinitis often happens in the shoulders, heels, or elbows. It might happen if your job involves doing the same motions over and over. °HOME CARE °· Use a sling or splint as told by your doctor. °· Put ice on the injured area. °¨ Put ice in a plastic bag. °¨ Place a towel between your skin and the bag. °¨ Leave the ice on for 15-20 minutes, 03-04 times a day. °· Avoid using your injured arm or leg until the pain goes away. °· Do gentle exercises only as told by your doctor. Stop exercises if the pain gets worse, unless your doctor tells you otherwise. °· Only take medicines as told by your doctor. °GET HELP RIGHT AWAY IF: °· Your pain and puffiness get worse. °· You have new problems, such as loss of feeling (numbness) in the hands. °MAKE SURE YOU: °· Understand these instructions. °· Will watch your condition. °· Will get help right away if you are not doing well or get worse. °Document Released: 01/14/2011 Document Revised: 12/27/2011 Document Reviewed: 01/14/2011 °ExitCare® Patient Information ©2015 [employer]. This information is not intended to replace advice given to you by your health care provider. Make sure you discuss any questions you have with your health care provider. ° °

## 2014-05-23 NOTE — ED Notes (Signed)
Pt reports right wrist pain x 2 days with pain and weakness 8/10 and worse with movement. Pt denies fall or injury.

## 2014-05-24 NOTE — ED Provider Notes (Signed)
Medical screening examination/treatment/procedure(s) were performed by non-physician practitioner and as supervising physician I was immediately available for consultation/collaboration.   EKG Interpretation None         Kimberley Dastrup, MD 05/24/14 0704 

## 2015-01-26 ENCOUNTER — Emergency Department (HOSPITAL_COMMUNITY)
Admission: EM | Admit: 2015-01-26 | Discharge: 2015-01-26 | Disposition: A | Discharge status: Home / Self Care | Payer: Self-pay | Attending: Emergency Medicine | Admitting: Emergency Medicine

## 2015-01-26 ENCOUNTER — Emergency Department (HOSPITAL_COMMUNITY): Payer: Self-pay

## 2015-01-26 DIAGNOSIS — M5432 Sciatica, left side: Secondary | ICD-10-CM | POA: Insufficient documentation

## 2015-01-26 DIAGNOSIS — Z79899 Other long term (current) drug therapy: Secondary | ICD-10-CM | POA: Insufficient documentation

## 2015-01-26 DIAGNOSIS — Z720 Tobacco use: Secondary | ICD-10-CM | POA: Insufficient documentation

## 2015-01-26 DIAGNOSIS — J40 Bronchitis, not specified as acute or chronic: Secondary | ICD-10-CM | POA: Insufficient documentation

## 2015-01-26 DIAGNOSIS — Z880 Allergy status to penicillin: Secondary | ICD-10-CM | POA: Insufficient documentation

## 2015-01-26 MED ORDER — PREDNISONE 20 MG PO TABS: ORAL_TABLET | ORAL | Status: DC

## 2015-01-26 MED ORDER — ALBUTEROL SULFATE HFA 108 (90 BASE) MCG/ACT IN AERS: 2.0000 | INHALATION_SPRAY | RESPIRATORY_TRACT | Status: AC | PRN

## 2015-01-26 MED ORDER — IPRATROPIUM-ALBUTEROL 0.5-2.5 (3) MG/3ML IN SOLN
3.0000 mL | Freq: Once | RESPIRATORY_TRACT | Status: AC
  Administered 2015-01-26: 3 mL via RESPIRATORY_TRACT
  Filled 2015-01-26: qty 3

## 2015-01-26 MED ORDER — NAPROXEN 500 MG PO TABS
500.0000 mg | ORAL_TABLET | Freq: Once | ORAL | Status: AC
  Administered 2015-01-26: 500 mg via ORAL
  Filled 2015-01-26: qty 1

## 2015-01-26 MED ORDER — NAPROXEN 500 MG PO TABS: 500.0000 mg | ORAL_TABLET | Freq: Two times a day (BID) | ORAL | Status: DC

## 2015-01-26 MED ORDER — PREDNISONE 20 MG PO TABS
60.0000 mg | ORAL_TABLET | Freq: Once | ORAL | Status: AC
  Administered 2015-01-26: 60 mg via ORAL
  Filled 2015-01-26: qty 3

## 2015-01-26 NOTE — ED Notes (Addendum)
Pt reports left sided back pain radiates down leg since last night.  Pain 7/10. Pt reports SOB as well, pt thinks it may come from the smoking.

## 2015-01-26 NOTE — ED Provider Notes (Signed)
CSN: [id number]     Arrival date & time 01/26/15  1617 History   First MD Initiated Contact with Patient 01/26/15 1754     Chief Complaint  Patient presents with  . Back Pain  . Shortness of Breath     (Consider location/radiation/quality/duration/timing/severity/associated sxs/prior Treatment) Patient is a 34 y.o. male presenting with back pain. The history is provided by the patient. No language interpreter was used.  Back Pain Location:  Sacro-iliac joint Quality:  Aching Radiates to:  L posterior upper leg Pain severity:  Moderate Pain is:  Same all the time Onset quality:  Unable to specify Duration:  1 day Timing:  Constant Progression:  Unchanged Chronicity:  New Relieved by:  Nothing Worsened by:  Ambulation Ineffective treatments:  None tried Associated symptoms: no abdominal pain, no abdominal swelling, no bladder incontinence, no bowel incontinence, no chest pain, no dysuria, no fever, no headaches, no leg pain, no numbness, no paresthesias, no pelvic pain, no perianal numbness, no tingling, no weakness and no weight loss   Associated symptoms comment:  Unrelated secondary complaint of SOB with cough/wheezing since yesterday, out of inhalor Risk factors: no hx of cancer, no hx of osteoporosis, no lack of exercise, no menopause, not obese, not pregnant, no recent surgery, no steroid use and no vascular disease     No past medical history on file. Past Surgical History  Procedure Laterality Date  . Hernia repair    . Tonsillectomy     No family history on file. History  Substance Use Topics  . Smoking status: Current Every Day Smoker -- 1.00 packs/day    Types: Cigarettes  . Smokeless tobacco: Not on file  . Alcohol Use: Yes     Comment: social    Review of Systems  Constitutional: Negative for fever, weight loss, activity change, appetite change and fatigue.  HENT: Negative for congestion, facial swelling, rhinorrhea and trouble swallowing.   Eyes: Negative  for photophobia and pain.  Respiratory: Negative for cough, chest tightness and shortness of breath.   Cardiovascular: Negative for chest pain and leg swelling.  Gastrointestinal: Negative for nausea, vomiting, abdominal pain, diarrhea, constipation and bowel incontinence.  Endocrine: Negative for polydipsia and polyuria.  Genitourinary: Negative for bladder incontinence, dysuria, urgency, decreased urine volume, difficulty urinating and pelvic pain.  Musculoskeletal: Positive for back pain. Negative for gait problem.  Skin: Negative for color change, rash and wound.  Allergic/Immunologic: Negative for immunocompromised state.  Neurological: Negative for dizziness, tingling, facial asymmetry, speech difficulty, weakness, numbness, headaches and paresthesias.  Psychiatric/Behavioral: Negative for confusion, decreased concentration and agitation.      Allergies  Bee venom and Penicillins  Home Medications   Prior to Admission medications   Medication Sig Start Date End Date Taking? Authorizing Provider  ibuprofen (ADVIL,MOTRIN) 200 MG tablet Take 800 mg by mouth every 6 (six) hours as needed for mild pain or moderate pain (back pain).    Yes Historical Provider, MD  albuterol (PROVENTIL HFA;VENTOLIN HFA) 108 (90 BASE) MCG/ACT inhaler Inhale 2 puffs into the lungs every 4 (four) hours as needed for wheezing or shortness of breath. 01/26/15   Toy CookeyMegan Hanny Elsberry, MD  naproxen (NAPROSYN) 500 MG tablet Take 1 tablet (500 mg total) by mouth 2 (two) times daily with a meal. 01/26/15   Toy CookeyMegan Hayslee Casebolt, MD  predniSONE (DELTASONE) 20 MG tablet 3 tabs po day one, then 2 po daily x 4 days 01/26/15   Toy CookeyMegan Charlean Carneal, MD   BP 126/70 mmHg  Pulse 67  Temp(Src) 98.7 F (37.1 C) (Oral)  Resp 16  SpO2 94% Physical Exam  Pulmonary/Chest: He has wheezes in the right upper field, the right middle field, the right lower field, the left upper field, the left middle field and the left lower field.  Musculoskeletal:         Back:    ED Course  Procedures (including critical care time) Labs Review Labs Reviewed - No data to display  Imaging Review Dg Chest 2 View  01/26/2015   CLINICAL DATA:  Shortness of breath for 12 hr, progressively worsening.  EXAM: CHEST  2 VIEW  COMPARISON:  None.  FINDINGS: Bronchial thickening. The cardiomediastinal contours are normal. Pulmonary vasculature is normal. No consolidation, pleural effusion, or pneumothorax. No acute osseous abnormalities are seen.  IMPRESSION: Bronchial thickening, can be seen with bronchitis or asthma.   Electronically Signed   By: Rubye Oaks M.D.   On: 01/26/2015 18:55     EKG Interpretation None      MDM   Final diagnoses:  Bronchitis  Sciatica of left side    Pt is a 34 y.o. male with Pmhx as above who presents with one day of left lower low-back pain with radiation to the back of the leg, worse with movement.  No known injury.  No numbness, weakness, fevers, bowel or bladder incontinence.  He is also had shortness of breath since yesterday with productive cough but no fevers, chills, chest pain, nausea, vomiting, diarrhea or body aches.  On physical exam vitals are stable and he is in no acute distress.  Positive leg raise test on the left. Neuro exam unremarkable, denies bowel/bladderincontinence, fever, weakness. Lungs with diffuse wheezing throughout.  Chest x-ray with bronchitic changes.  Patient treated with DuoNeb naproxen by mouth prednisone in department.  Suspect patient has a left-sided sciatica as well as asthma exacerbation with bronchitis.  Be discharged home with naproxen and prednisone for pain, as well as albuterol MDI     Chrys Racer evaluation in the Emergency Department is complete. It has been determined that no acute conditions requiring further emergency intervention are present at this time. The patient/guardian have been advised of the diagnosis and plan. We have discussed signs and symptoms that warrant  return to the ED, such as changes or worsening in symptoms, worsening SOB, numbness, weakness, bowel/bladder incontinence.       Toy Cookey, MD 01/26/15 2109

## 2015-01-26 NOTE — Discharge Instructions (Signed)
Acute Bronchitis Bronchitis is inflammation of the airways that extend from the windpipe into the lungs (bronchi). The inflammation often causes mucus to develop. This leads to a cough, which is the most common symptom of bronchitis.  In acute bronchitis, the condition usually develops suddenly and goes away over time, usually in a couple weeks. Smoking, allergies, and asthma can make bronchitis worse. Repeated episodes of bronchitis may cause further lung problems.  CAUSES Acute bronchitis is most often caused by the same virus that causes a cold. The virus can spread from person to person (contagious) through coughing, sneezing, and touching contaminated objects. SIGNS AND SYMPTOMS  Cough. Sciatica Sciatica is pain, weakness, numbness, or tingling along the path of the sciatic nerve. The nerve starts in the lower back and runs down the back of each leg. The nerve controls the muscles in the lower leg and in the back of the knee, while also providing sensation to the back of the thigh, lower leg, and the sole of your foot. Sciatica is a symptom of another medical condition. For instance, nerve damage or certain conditions, such as a herniated disk or bone spur on the spine, pinch or put pressure on the sciatic nerve. This causes the pain, weakness, or other sensations normally associated with sciatica. Generally, sciatica only affects one side of the body. CAUSES   Herniated or slipped disc.  Degenerative disk disease.  A pain disorder involving the narrow muscle in the buttocks (piriformis syndrome).  Pelvic injury or fracture.  Pregnancy.  Tumor (rare). SYMPTOMS  Symptoms can vary from mild to very severe. The symptoms usually travel from the low back to the buttocks and down the back of the leg. Symptoms can include:  Mild tingling or dull aches in the lower back, leg, or hip.  Numbness in the back of the calf or sole of the foot.  Burning sensations in the lower back, leg, or  hip.  Sharp pains in the lower back, leg, or hip.  Leg weakness.  Severe back pain inhibiting movement. These symptoms may get worse with coughing, sneezing, laughing, or prolonged sitting or standing. Also, being overweight may worsen symptoms. DIAGNOSIS  Your caregiver will perform a physical exam to look for common symptoms of sciatica. He or she may ask you to do certain movements or activities that would trigger sciatic nerve pain. Other tests may be performed to find the cause of the sciatica. These may include:  Blood tests.  X-rays.  Imaging tests, such as an MRI or CT scan. TREATMENT  Treatment is directed at the cause of the sciatic pain. Sometimes, treatment is not necessary and the pain and discomfort goes away on its own. If treatment is needed, your caregiver may suggest:  Over-the-counter medicines to relieve pain.  Prescription medicines, such as anti-inflammatory medicine, muscle relaxants, or narcotics.  Applying heat or ice to the painful area.  Steroid injections to lessen pain, irritation, and inflammation around the nerve.  Reducing activity during periods of pain.  Exercising and stretching to strengthen your abdomen and improve flexibility of your spine. Your caregiver may suggest losing weight if the extra weight makes the back pain worse.  Physical therapy.  Surgery to eliminate what is pressing or pinching the nerve, such as a bone spur or part of a herniated disk. HOME CARE INSTRUCTIONS   Only take over-the-counter or prescription medicines for pain or discomfort as directed by your caregiver.  Apply ice to the affected area for 20 minutes, 3-4  times a day for the first 48-72 hours. Then try heat in the same way.  Exercise, stretch, or perform your usual activities if these do not aggravate your pain.  Attend physical therapy sessions as directed by your caregiver.  Keep all follow-up appointments as directed by your caregiver.  Do not wear  high heels or shoes that do not provide proper support.  Check your mattress to see if it is too soft. A firm mattress may lessen your pain and discomfort. SEEK IMMEDIATE MEDICAL CARE IF:   You lose control of your bowel or bladder (incontinence).  You have increasing weakness in the lower back, pelvis, buttocks, or legs.  You have redness or swelling of your back.  You have a burning sensation when you urinate.  You have pain that gets worse when you lie down or awakens you at night.  Your pain is worse than you have experienced in the past.  Your pain is lasting longer than 4 weeks.  You are suddenly losing weight without reason. MAKE SURE YOU:  Understand these instructions.  Will watch your condition.  Will get help right away if you are not doing well or get worse. Document Released: 09/28/2001 Document Revised: 04/04/2012 Document Reviewed: 02/13/2012 [facility] Patient Information 2015 [place], [place]. This information is not intended to replace advice given to you by your health care provider. Make sure you discuss any questions you have with your health care provider.    Fever.   Coughing up mucus.   Body aches.   Chest congestion.   Chills.   Shortness of breath.   Sore throat.  DIAGNOSIS  Acute bronchitis is usually diagnosed through a physical exam. Your health care provider will also ask you questions about your medical history. Tests, such as chest X-rays, are sometimes done to rule out other conditions.  TREATMENT  Acute bronchitis usually goes away in a couple weeks. Oftentimes, no medical treatment is necessary. Medicines are sometimes given for relief of fever or cough. Antibiotic medicines are usually not needed but may be prescribed in certain situations. In some cases, an inhaler may be recommended to help reduce shortness of breath and control the cough. A cool mist vaporizer may also be used to help thin bronchial secretions and make it  easier to clear the chest.  HOME CARE INSTRUCTIONS  Get plenty of rest.   Drink enough fluids to keep your urine clear or pale yellow (unless you have a medical condition that requires fluid restriction). Increasing fluids may help thin your respiratory secretions (sputum) and reduce chest congestion, and it will prevent dehydration.   Take medicines only as directed by your health care provider.  If you were prescribed an antibiotic medicine, finish it all even if you start to feel better.  Avoid smoking and secondhand smoke. Exposure to cigarette smoke or irritating chemicals will make bronchitis worse. If you are a smoker, consider using nicotine gum or skin patches to help control withdrawal symptoms. Quitting smoking will help your lungs heal faster.   Reduce the chances of another bout of acute bronchitis by washing your hands frequently, avoiding people with cold symptoms, and trying not to touch your hands to your mouth, nose, or eyes.   Keep all follow-up visits as directed by your health care provider.  SEEK MEDICAL CARE IF: Your symptoms do not improve after 1 week of treatment.  SEEK IMMEDIATE MEDICAL CARE IF:  You develop an increased fever or chills.   You have chest pain.  You have severe shortness of breath.  You have bloody sputum.   You develop dehydration.  You faint or repeatedly feel like you are going to pass out.  You develop repeated vomiting.  You develop a severe headache. MAKE SURE YOU:   Understand these instructions.  Will watch your condition.  Will get help right away if you are not doing well or get worse. Document Released: 11/11/2004 Document Revised: 02/18/2014 Document Reviewed: 03/27/2013 [facility] Patient Information 2015 [place], [place]. This information is not intended to replace advice given to you by your health care provider. Make sure you discuss any questions you have with your health care provider.

## 2015-07-07 ENCOUNTER — Emergency Department (HOSPITAL_COMMUNITY)
Admission: EM | Admit: 2015-07-07 | Discharge: 2015-07-07 | Disposition: A | Discharge status: Home / Self Care | Payer: BLUE CROSS/BLUE SHIELD | Attending: Emergency Medicine | Admitting: Emergency Medicine

## 2015-07-07 ENCOUNTER — Encounter (HOSPITAL_COMMUNITY): Payer: Self-pay

## 2015-07-07 DIAGNOSIS — M62830 Muscle spasm of back: Secondary | ICD-10-CM | POA: Diagnosis not present

## 2015-07-07 DIAGNOSIS — M545 Low back pain: Secondary | ICD-10-CM | POA: Diagnosis present

## 2015-07-07 DIAGNOSIS — Z79899 Other long term (current) drug therapy: Secondary | ICD-10-CM | POA: Diagnosis not present

## 2015-07-07 DIAGNOSIS — Z880 Allergy status to penicillin: Secondary | ICD-10-CM | POA: Insufficient documentation

## 2015-07-07 DIAGNOSIS — Z720 Tobacco use: Secondary | ICD-10-CM | POA: Insufficient documentation

## 2015-07-07 MED ORDER — METHOCARBAMOL 500 MG PO TABS: 500.0000 mg | ORAL_TABLET | Freq: Two times a day (BID) | ORAL | Status: DC | PRN

## 2015-07-07 MED ORDER — KETOROLAC TROMETHAMINE 60 MG/2ML IM SOLN
30.0000 mg | Freq: Once | INTRAMUSCULAR | Status: AC
  Administered 2015-07-07: 30 mg via INTRAMUSCULAR
  Filled 2015-07-07: qty 2

## 2015-07-07 MED ORDER — NAPROXEN 250 MG PO TABS: 250.0000 mg | ORAL_TABLET | Freq: Two times a day (BID) | ORAL | Status: DC

## 2015-07-07 MED ORDER — METHOCARBAMOL 500 MG PO TABS
500.0000 mg | ORAL_TABLET | Freq: Once | ORAL | Status: AC
Start: 2015-07-07 — End: 2015-07-07
  Administered 2015-07-07: 500 mg via ORAL
  Filled 2015-07-07: qty 1

## 2015-07-07 NOTE — ED Notes (Signed)
ED PA at bedside

## 2015-07-07 NOTE — ED Provider Notes (Signed)
CSN: [id number]     Arrival date & time 07/07/15  1341 History  This chart was scribed for Nelva Nay, MD by Placido Sou, ED scribe. This patient was seen in room WTR8/WTR8 and the patient's care was started at 3:29 PM.   Chief Complaint  Patient presents with  . Back Pain   The history is provided by the patient. No language interpreter was used.    HPI Comments: Bradley Moreno is a 34 y.o. male, with a hx of asthma, who presents to the Emergency Department complaining of constant, moderate, centralized lower back pain with onset 1 day ago. Pt notes leaving work and feeling as if he tweaked his back with his pain resulting thereafter. He rates his current back pain as 9/10. His pain is worse with movement. He denies any heavy lifting, recent falls or any recent trauma. Pt denies taking any medications for pain management. Pt notes a hx of wheezing and SOB due to smoking but denies any worsening of his chronic symptoms. Pt denies a hx of IVDA, kidney issues, abd issues or CA. He denies incontinence of his bowels or bladder, n/v, numbness, tingling or weakness.   History reviewed. No pertinent past medical history. Past Surgical History  Procedure Laterality Date  . Hernia repair    . Tonsillectomy     History reviewed. No pertinent family history. Social History  Substance Use Topics  . Smoking status: Current Every Day Smoker -- 1.00 packs/day    Types: Cigarettes  . Smokeless tobacco: None  . Alcohol Use: Yes     Comment: social    Review of Systems  Constitutional: Negative for fever and chills.  Cardiovascular: Negative for chest pain.  Gastrointestinal: Negative for nausea, vomiting and abdominal pain.  Genitourinary: Negative for dysuria and difficulty urinating.  Musculoskeletal: Positive for myalgias and back pain.  Skin: Negative for wound.  Neurological: Negative for weakness, light-headedness and numbness.   Allergies  Bee venom and Penicillins  Home Medications    Prior to Admission medications   Medication Sig Start Date End Date Taking? Authorizing Provider  albuterol (PROVENTIL HFA;VENTOLIN HFA) 108 (90 BASE) MCG/ACT inhaler Inhale 2 puffs into the lungs every 4 (four) hours as needed for wheezing or shortness of breath. 01/26/15   Toy Cookey, MD  methocarbamol (ROBAXIN) 500 MG tablet Take 1 tablet (500 mg total) by mouth 2 (two) times daily as needed for muscle spasms. 07/07/15   Everlene Farrier, PA-C  naproxen (NAPROSYN) 250 MG tablet Take 1 tablet (250 mg total) by mouth 2 (two) times daily with a meal. 07/07/15   Everlene Farrier, PA-C   BP 113/80 mmHg  Pulse 71  Temp(Src) 97.8 F (36.6 C) (Oral)  Resp 18  Ht  (1.651 m)  Wt 235 lb (106.595 kg)  BMI 39.11 kg/m2  SpO2 99% Physical Exam  Constitutional: He appears well-developed and well-nourished. No distress.  Nontoxic appearing.  HENT:  Head: Normocephalic and atraumatic.  Eyes: Conjunctivae are normal. Pupils are equal, round, and reactive to light. Right eye exhibits no discharge. Left eye exhibits no discharge.  Neck: Normal range of motion. Neck supple.  No midline neck tenderness.  Cardiovascular: Normal rate, regular rhythm, normal heart sounds and intact distal pulses.   Pulses:      Radial pulses are 2+ on the right side, and 2+ on the left side.  Pulmonary/Chest: Effort normal and breath sounds normal. No respiratory distress. He has no wheezes. He has no rales.  Lungs  are clear to auscultation bilaterally.  Abdominal: Soft. There is no tenderness. There is no guarding.  Musculoskeletal: Normal range of motion. He exhibits tenderness. He exhibits no edema.  Bilateral lower back TTP; Paraspinous muscles feel to be in spasm. No back erythema, edema, ecchymosis. No midline bony point back tenderness. Strength is 5/5 to bilateral lower extremities. No lower extremity edema or tenderness. Able to ambulate without difficulty or assistance.  Lymphadenopathy:    He has no  cervical adenopathy.  Neurological: He is alert. He has normal reflexes. He displays normal reflexes. Coordination normal.  Reflex Scores:      Patellar reflexes are 2+ on the right side and 2+ on the left side. Normal gait. Bilateral patellar DTRs are intact. Sensation intact in his bilateral lower extremities.  Skin: Skin is warm and dry. No rash noted. He is not diaphoretic. No erythema. No pallor.  Psychiatric: He has a normal mood and affect. His behavior is normal.  Nursing note and vitals reviewed.  ED Course  Procedures  DIAGNOSTIC STUDIES: Oxygen Saturation is 99% on RA, normal by my interpretation.    COORDINATION OF CARE: 3:35 PM Discussed treatment plan with pt at bedside including 1x toradol injection and 1x robaxin as well as rx's for naprosyn and robaxin. RICE method was also discussed with pt. Pt agreed to plan.  Labs Review Labs Reviewed - No data to display  Imaging Review No results found.    EKG Interpretation None      Filed Vitals:   07/07/15 1420 07/07/15 1542  BP: 113/80   Pulse: 71   Temp: 97.8 F (36.6 C)   TempSrc: Oral   Resp: 18   Height:   (1.651 m)  Weight:  235 lb (106.595 kg)  SpO2: 99%      MDM   Meds given in ED:  Medications  ketorolac (TORADOL) injection 30 mg (30 mg Intramuscular Given 07/07/15 1543)  methocarbamol (ROBAXIN) tablet 500 mg (500 mg Oral Given 07/07/15 1543)    New Prescriptions   METHOCARBAMOL (ROBAXIN) 500 MG TABLET    Take 1 tablet (500 mg total) by mouth 2 (two) times daily as needed for muscle spasms.   NAPROXEN (NAPROSYN) 250 MG TABLET    Take 1 tablet (250 mg total) by mouth 2 (two) times daily with a meal.    Final diagnoses:  Back muscle spasm   Patient with low back pain and his back feels to be in spasm.  No neurological deficits and normal neuro exam.  Patient can walk with normal gait.  No loss of bowel or bladder control.  No concern for cauda equina.  No fever, night sweats, weight loss, h/o  cancer, IVDU.  RICE protocol and pain medicine indicated and discussed with patient.  I advised the patient to follow-up with their primary care provider this week. I advised the patient to return to the emergency department with new or worsening symptoms or new concerns. The patient verbalized understanding and agreement with plan.    I personally performed the services described in this documentation, which was scribed in my presence. The recorded information has been reviewed and is accurate.      Everlene Farrier, PA-C 07/07/15 1546  Nelva Nay, MD 07/07/15 [phone]

## 2015-07-07 NOTE — ED Notes (Signed)
Pt started having back pain yesterday at work yesterday.  Difficulty walking today.  Pain is getting worse.

## 2015-07-07 NOTE — Discharge Instructions (Signed)
Back Exercises °Back exercises help treat and prevent back injuries. The goal of back exercises is to increase the strength of your abdominal and back muscles and the flexibility of your back. These exercises should be started when you no longer have back pain. Back exercises include: °· Pelvic Tilt. Lie on your back with your knees bent. Tilt your pelvis until the lower part of your back is against the floor. Hold this position 5 to 10 sec and repeat 5 to 10 times. °· Knee to Chest. Pull first 1 knee up against your chest and hold for 20 to 30 seconds, repeat this with the other knee, and then both knees. This may be done with the other leg straight or bent, whichever feels better. °· Sit-Ups or Curl-Ups. Bend your knees 90 degrees. Start with tilting your pelvis, and do a partial, slow sit-up, lifting your trunk only 30 to 45 degrees off the floor. Take at least 2 to 3 seconds for each sit-up. Do not do sit-ups with your knees out straight. If partial sit-ups are difficult, simply do the above but with only tightening your abdominal muscles and holding it as directed. °· Hip-Lift. Lie on your back with your knees flexed 90 degrees. Push down with your feet and shoulders as you raise your hips a couple inches off the floor; hold for 10 seconds, repeat 5 to 10 times. °· Back arches. Lie on your stomach, propping yourself up on bent elbows. Slowly press on your hands, causing an arch in your low back. Repeat 3 to 5 times. Any initial stiffness and discomfort should lessen with repetition over time. °· Shoulder-Lifts. Lie face down with arms beside your body. Keep hips and torso pressed to floor as you slowly lift your head and shoulders off the floor. °Do not overdo your exercises, especially in the beginning. Exercises may cause you some mild back discomfort which lasts for a few minutes; however, if the pain is more severe, or lasts for more than 15 minutes, do not continue exercises until you see your caregiver.  Improvement with exercise therapy for back problems is slow.  °See your caregivers for assistance with developing a proper back exercise program. °Document Released: 11/11/2004 Document Revised: 12/27/2011 Document Reviewed: 08/05/2011 °[employer]® Patient Information ©2015 [employer]. This information is not intended to replace advice given to you by your health care provider. Make sure you discuss any questions you have with your health care provider. °Muscle Cramps and Spasms °Muscle cramps and spasms occur when a muscle or muscles tighten and you have no control over this tightening (involuntary muscle contraction). They are a common problem and can develop in any muscle. The most common place is in the calf muscles of the leg. Both muscle cramps and muscle spasms are involuntary muscle contractions, but they also have differences:  °· Muscle cramps are sporadic and painful. They may last a few seconds to a quarter of an hour. Muscle cramps are often more forceful and last longer than muscle spasms. °· Muscle spasms may or may not be painful. They may also last just a few seconds or much longer. °CAUSES  °It is uncommon for cramps or spasms to be due to a serious underlying problem. In many cases, the cause of cramps or spasms is unknown. Some common causes are:  °· Overexertion.   °· Overuse from repetitive motions (doing the same thing over and over).   °· Remaining in a certain position for a long period of time.   °· Improper   preparation, form, or technique while performing a sport or activity.   Dehydration.   Injury.   Side effects of some medicines.   Abnormally low levels of the salts and ions in your blood (electrolytes), especially potassium and calcium. This could happen if you are taking water pills (diuretics) or you are pregnant.  Some underlying medical problems can make it more likely to develop cramps or spasms. These include, but are not limited to:   Diabetes.   Parkinson  disease.   Hormone disorders, such as thyroid problems.   Alcohol abuse.   Diseases specific to muscles, joints, and bones.   Blood vessel disease where not enough blood is getting to the muscles.  HOME CARE INSTRUCTIONS   Stay well hydrated. Drink enough water and fluids to keep your urine clear or pale yellow.  It may be helpful to massage, stretch, and relax the affected muscle.  For tight or tense muscles, use a warm towel, heating pad, or hot shower water directed to the affected area.  If you are sore or have pain after a cramp or spasm, applying ice to the affected area may relieve discomfort.  Put ice in a plastic bag.  Place a towel between your skin and the bag.  Leave the ice on for 15-20 minutes, 03-04 times a day.  Medicines used to treat a known cause of cramps or spasms may help reduce their frequency or severity. Only take over-the-counter or prescription medicines as directed by your caregiver. SEEK MEDICAL CARE IF:  Your cramps or spasms get more severe, more frequent, or do not improve over time.  MAKE SURE YOU:   Understand these instructions.  Will watch your condition.  Will get help right away if you are not doing well or get worse. Document Released: 03/26/2002 Document Revised: 01/29/2013 Document Reviewed: 09/20/2012 [facility] Patient Information 2015 [place], [place]. This information is not intended to replace advice given to you by your health care provider. Make sure you discuss any questions you have with your health care provider. Back Pain, Adult Low back pain is very common. About 1 in 5 people have back pain.The cause of low back pain is rarely dangerous. The pain often gets better over time.About half of people with a sudden onset of back pain feel better in just 2 weeks. About 8 in 10 people feel better by 6 weeks.  CAUSES Some common causes of back pain include:  Strain of the muscles or ligaments supporting the spine.  Wear and  tear (degeneration) of the spinal discs.  Arthritis.  Direct injury to the back. DIAGNOSIS Most of the time, the direct cause of low back pain is not known.However, back pain can be treated effectively even when the exact cause of the pain is unknown.Answering your caregiver's questions about your overall health and symptoms is one of the most accurate ways to make sure the cause of your pain is not dangerous. If your caregiver needs more information, he or she may order lab work or imaging tests (X-rays or MRIs).However, even if imaging tests show changes in your back, this usually does not require surgery. HOME CARE INSTRUCTIONS For many people, back pain returns.Since low back pain is rarely dangerous, it is often a condition that people can learn to [facility] their own.   Remain active. It is stressful on the back to sit or stand in one place. Do not sit, drive, or stand in one place for more than 30 minutes at a time. Take  short walks on level surfaces as soon as pain allows.Try to increase the length of time you walk each day.  Do not stay in bed.Resting more than 1 or 2 days can delay your recovery.  Do not avoid exercise or work.Your body is made to move.It is not dangerous to be active, even though your back may hurt.Your back will likely heal faster if you return to being active before your pain is gone.  Pay attention to your body when you bend and lift. Many people have less discomfortwhen lifting if they bend their knees, keep the load close to their bodies,and avoid twisting. Often, the most comfortable positions are those that put less stress on your recovering back.  Find a comfortable position to sleep. Use a firm mattress and lie on your side with your knees slightly bent. If you lie on your back, put a pillow under your knees.  Only take over-the-counter or prescription medicines as directed by your caregiver. Over-the-counter medicines to reduce pain and  inflammation are often the most helpful.Your caregiver may prescribe muscle relaxant drugs.These medicines help dull your pain so you can more quickly return to your normal activities and healthy exercise.  Put ice on the injured area.  Put ice in a plastic bag.  Place a towel between your skin and the bag.  Leave the ice on for 15-20 minutes, 03-04 times a day for the first 2 to 3 days. After that, ice and heat may be alternated to reduce pain and spasms.  Ask your caregiver about trying back exercises and gentle massage. This may be of some benefit.  Avoid feeling anxious or stressed.Stress increases muscle tension and can worsen back pain.It is important to recognize when you are anxious or stressed and learn ways to manage it.Exercise is a great option. SEEK MEDICAL CARE IF:  You have pain that is not relieved with rest or medicine.  You have pain that does not improve in 1 week.  You have new symptoms.  You are generally not feeling well. SEEK IMMEDIATE MEDICAL CARE IF:   You have pain that radiates from your back into your legs.  You develop new bowel or bladder control problems.  You have unusual weakness or numbness in your arms or legs.  You develop nausea or vomiting.  You develop abdominal pain.  You feel faint. Document Released: 10/04/2005 Document Revised: 04/04/2012 Document Reviewed: 02/05/2014 [facility] Patient Information 2015 [place], [place]. This information is not intended to replace advice given to you by your health care provider. Make sure you discuss any questions you have with your health care provider.

## 2017-04-24 ENCOUNTER — Encounter (HOSPITAL_COMMUNITY): Payer: Self-pay | Admitting: Emergency Medicine

## 2017-04-24 ENCOUNTER — Emergency Department (HOSPITAL_COMMUNITY)
Admission: EM | Admit: 2017-04-24 | Discharge: 2017-04-24 | Disposition: A | Discharge status: Home / Self Care | Payer: BLUE CROSS/BLUE SHIELD | Attending: Emergency Medicine | Admitting: Emergency Medicine

## 2017-04-24 DIAGNOSIS — W260XXA Contact with knife, initial encounter: Secondary | ICD-10-CM | POA: Insufficient documentation

## 2017-04-24 DIAGNOSIS — Y990 Civilian activity done for income or pay: Secondary | ICD-10-CM | POA: Insufficient documentation

## 2017-04-24 DIAGNOSIS — F17210 Nicotine dependence, cigarettes, uncomplicated: Secondary | ICD-10-CM | POA: Insufficient documentation

## 2017-04-24 DIAGNOSIS — S61412A Laceration without foreign body of left hand, initial encounter: Secondary | ICD-10-CM

## 2017-04-24 DIAGNOSIS — Y9389 Activity, other specified: Secondary | ICD-10-CM | POA: Insufficient documentation

## 2017-04-24 DIAGNOSIS — Z23 Encounter for immunization: Secondary | ICD-10-CM | POA: Insufficient documentation

## 2017-04-24 DIAGNOSIS — Y9289 Other specified places as the place of occurrence of the external cause: Secondary | ICD-10-CM | POA: Insufficient documentation

## 2017-04-24 MED ORDER — LIDOCAINE HCL (PF) 1 % IJ SOLN: 5.0000 mL | Freq: Once | INTRAMUSCULAR | Status: DC

## 2017-04-24 MED ORDER — LIDOCAINE HCL (PF) 1 % IJ SOLN
20.0000 mL | Freq: Once | INTRAMUSCULAR | Status: AC
  Administered 2017-04-24: 20 mL

## 2017-04-24 MED ORDER — TETANUS-DIPHTH-ACELL PERTUSSIS 5-2.5-18.5 LF-MCG/0.5 IM SUSP
0.5000 mL | Freq: Once | INTRAMUSCULAR | Status: AC
  Administered 2017-04-24: 0.5 mL via INTRAMUSCULAR
  Filled 2017-04-24: qty 0.5

## 2017-04-24 NOTE — ED Notes (Signed)
Bed: WTR7 Expected date:  Expected time:  Means of arrival:  Comments: 

## 2017-04-24 NOTE — Discharge Instructions (Signed)
You may remove the bandage after 24 hours. Clean the wound and surrounding area gently with tap water and mild soap. Rinse well and blot dry. Do not scrub the wound, as this may cause the wound edges to come apart. You may shower, but avoid submerging the wound, such as with a bath or swimming. °Clean the wound daily to prevent infection. Do not use cleaners such as hydrogen peroxide or alcohol.  ° °Scar reduction: Application of a topical antibiotic ointment, such as Neosporin, after the wound has begun to close and heal well can decrease scab formation and reduce scarring. After the wound has healed and wound closures have been removed, application of ointments such as Aquaphor can also reduce scar formation.  °The key to scar reduction is keeping the skin well hydrated and supple. Drinking plenty of water throughout the day (At least eight 8oz glasses of water a day) is essential to staying well hydrated. ° °Sun exposure: Keep the wound out of the sun. After the wound has healed, continue to protect it from the sun by wearing protective clothing or applying sunscreen. ° °Pain: You may use Tylenol, naproxen, or ibuprofen for pain. ° °Suture/staple removal: Return to the ED in 8-10 days for suture removal. ° °Return to the ED sooner should the wound edges come apart or signs of infection arise, such as spreading redness, puffiness/swelling, pus draining from the wound, severe increase in pain, fever over 100.3°F, or any other major issues. °

## 2017-04-24 NOTE — ED Provider Notes (Signed)
[facility]-EMERGENCY DEPT Provider Note   CSN: [id number] Arrival date & time: 04/24/17  0930  By signing my name below, I, Rosana Fret, attest that this documentation has been prepared under the direction and in the presence of non-physician practitioner, Alieah Brinton C., PA-C. Electronically Signed: Rosana Fret, ED Scribe. 04/24/17. 11:51 AM.  History   Chief Complaint Chief Complaint  Patient presents with  . Extremity Laceration   The history is provided by the patient. No language interpreter was used.   HPI Comments: Bradley Moreno is a 36 y.o. male who presents to the Emergency Department complaining of a sudden onset, mild laceration to the dorsum of left hand that occurred this morning. Pt states he cut himself while using the knife sharpener at work. Per pt, the knife was clean and had not been used today. Pt does not know when his last tetanus shot was. Pt reports 7/10, aching pain to the area, nonradiating. Denies numbness, weakness, or any other complaints.  History reviewed. No pertinent past medical history.  There are no active problems to display for this patient.   Past Surgical History:  Procedure Laterality Date  . HERNIA REPAIR    . TONSILLECTOMY         Home Medications    Prior to Admission medications   Medication Sig Start Date End Date Taking? Authorizing Provider  albuterol (PROVENTIL HFA;VENTOLIN HFA) 108 (90 BASE) MCG/ACT inhaler Inhale 2 puffs into the lungs every 4 (four) hours as needed for wheezing or shortness of breath. 01/26/15   Toy Cookey, MD  methocarbamol (ROBAXIN) 500 MG tablet Take 1 tablet (500 mg total) by mouth 2 (two) times daily as needed for muscle spasms. 07/07/15   Everlene Farrier, PA-C  naproxen (NAPROSYN) 250 MG tablet Take 1 tablet (250 mg total) by mouth 2 (two) times daily with a meal. 07/07/15   Everlene Farrier, PA-C    Family History Family History  Problem Relation Age of Onset  . Cancer Mother   . Stroke Father      Social History Social History  Substance Use Topics  . Smoking status: Current Every Day Smoker    Packs/day: 0.50    Types: Cigarettes  . Smokeless tobacco: Never Used  . Alcohol use No     Allergies   Bee venom and Penicillins   Review of Systems Review of Systems  Skin: Positive for wound.  Neurological: Negative for weakness and numbness.     Physical Exam Updated Vital Signs BP (!) 141/91 (BP Location: Right Arm)   Pulse 78   Temp 98 F (36.7 C) (Oral)   Resp 18   Wt 209 lb 2 oz (94.9 kg)   SpO2 96%   BMI 34.80 kg/m   Physical Exam  Constitutional: He appears well-developed and well-nourished. No distress.  HENT:  Head: Normocephalic.  Eyes: Conjunctivae are normal.  Neck: Neck supple.  Cardiovascular: Normal rate, regular rhythm and intact distal pulses.   Pulmonary/Chest: Effort normal.  Musculoskeletal: He exhibits no deformity.  Full range of motion in the fingers of left hand.  Neurological: He is alert. No sensory deficit.  Sensation intact to the fingers of the left hand as well as the skin surrounding the wound. Grip strength 5 out of 5. Flexion and extension of the MCP joint of the index finger intact against resistance.  Skin: Skin is warm and dry. Capillary refill takes less than 2 seconds. Laceration noted. He is not diaphoretic.  2.5 cm v-shaped laceration just  proximal to the MCP joint on the index finger on the dorsal side of left the hand. No tendon, muscle, or bone exposure.  Psychiatric: He has a normal mood and affect. His behavior is normal.  Nursing note and vitals reviewed.    ED Treatments / Results  DIAGNOSTIC STUDIES: Oxygen Saturation is 98% on RA, normal by my interpretation.   COORDINATION OF CARE: 10:34 AM-Discussed next steps with pt including a tetanus shot and a laceration repair. Pt verbalized understanding and is agreeable with the plan.   Labs (all labs ordered are listed, but only abnormal results are  displayed) Labs Reviewed - No data to display  EKG  EKG Interpretation None       Radiology No results found.  Procedures .Marland Kitchen.Laceration Repair Date/Time: 04/24/2017 11:51 AM Performed by: Anselm PancoastJOY, Emilina Smarr C Authorized by: Anselm PancoastJOY, Tanaiya Kolarik C   Consent:    Consent obtained:  Verbal   Consent given by:  Patient   Risks discussed:  Infection and pain Anesthesia (see MAR for exact dosages):    Anesthesia method:  Local infiltration   Local anesthetic:  Lidocaine 1% w/o epi Laceration details:    Location:  Hand   Hand location:  L hand, dorsum   Length (cm):  2.5 Repair type:    Repair type:  Simple Pre-procedure details:    Preparation:  Patient was prepped and draped in usual sterile fashion Exploration:    Hemostasis achieved with:  Direct pressure   Wound exploration: wound explored through full range of motion and entire depth of wound probed and visualized   Treatment:    Area cleansed with:  Betadine   Amount of cleaning:  Extensive   Irrigation solution:  Tap water Skin repair:    Repair method:  Sutures   Suture size:  5-0   Suture material:  Prolene   Suture technique:  Simple interrupted   Number of sutures:  5 Approximation:    Approximation:  Close Post-procedure details:    Dressing:  Non-adherent dressing   Patient tolerance of procedure:  Tolerated well, no immediate complications Comments:     Hand function tested before and after laceration repair with no deficit noted.     (including critical care time)  Medications Ordered in ED Medications  Tdap (BOOSTRIX) injection 0.5 mL (0.5 mLs Intramuscular Given 04/24/17 1202)  lidocaine (PF) (XYLOCAINE) 1 % injection 20 mL (20 mLs Infiltration Given 04/24/17 1211)     Initial Impression / Assessment and Plan / ED Course  I have reviewed the triage vital signs and the nursing notes.  Pertinent labs & imaging results that were available during my care of the patient were reviewed by me and considered in my  medical decision making (see chart for details).     Patient presents with a hand laceration. Laceration repair performed without immediate complication. Wound care and return instructions discussed. Patient voices understanding of all instructions and is comfortable with discharge.    Final Clinical Impressions(s) / ED Diagnoses   Final diagnoses:  Laceration of left hand without foreign body, initial encounter    New Prescriptions Discharge Medication List as of 04/24/2017 12:14 PM     I personally performed the services described in this documentation, which was scribed in my presence. The recorded information has been reviewed and is accurate.    Concepcion LivingJoy, Seena Face C, PA-C 04/25/17 2118    Raeford RazorKohut, Stephen, MD 04/26/17 [phone]

## 2017-04-24 NOTE — ED Triage Notes (Signed)
Pt stated that he was sharpening a clean knife approx. 30 minutes ago.. Blade slid across l/hand, at knuckle on index finger. 1.5 cm open laceration noted. Bleeding controlled.

## 2018-03-18 ENCOUNTER — Other Ambulatory Visit: Payer: Self-pay

## 2018-03-18 ENCOUNTER — Encounter (HOSPITAL_COMMUNITY): Payer: Self-pay

## 2018-03-18 ENCOUNTER — Emergency Department (HOSPITAL_COMMUNITY)
Admission: EM | Admit: 2018-03-18 | Discharge: 2018-03-18 | Disposition: A | Discharge status: Home / Self Care | Payer: BLUE CROSS/BLUE SHIELD | Attending: Emergency Medicine | Admitting: Emergency Medicine

## 2018-03-18 ENCOUNTER — Emergency Department (HOSPITAL_COMMUNITY): Payer: BLUE CROSS/BLUE SHIELD

## 2018-03-18 DIAGNOSIS — Y92511 Restaurant or cafe as the place of occurrence of the external cause: Secondary | ICD-10-CM | POA: Insufficient documentation

## 2018-03-18 DIAGNOSIS — S61012A Laceration without foreign body of left thumb without damage to nail, initial encounter: Secondary | ICD-10-CM | POA: Insufficient documentation

## 2018-03-18 DIAGNOSIS — Z79899 Other long term (current) drug therapy: Secondary | ICD-10-CM | POA: Insufficient documentation

## 2018-03-18 DIAGNOSIS — Y990 Civilian activity done for income or pay: Secondary | ICD-10-CM | POA: Insufficient documentation

## 2018-03-18 DIAGNOSIS — W260XXA Contact with knife, initial encounter: Secondary | ICD-10-CM | POA: Insufficient documentation

## 2018-03-18 DIAGNOSIS — F17210 Nicotine dependence, cigarettes, uncomplicated: Secondary | ICD-10-CM | POA: Insufficient documentation

## 2018-03-18 DIAGNOSIS — Y93G1 Activity, food preparation and clean up: Secondary | ICD-10-CM | POA: Insufficient documentation

## 2018-03-18 NOTE — ED Provider Notes (Signed)
[facility]-EMERGENCY DEPT Provider Note   CSN: [id number] Arrival date & time: 03/18/18  1340     History   Chief Complaint Chief Complaint  Patient presents with  . Extremity Laceration    HPI Bradley Moreno is a 37 y.o. male presents today for evaluation of acute onset, persistent laceration to the left thumb which occurred at around 1 PM today.  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  He states that it did bleed initially but this stopped with direct pressure.  He states that he cleaned the wound and attempted to apply superglue but was unsuccessful.  He does note mild burning pain at this time around the site of the laceration.  He denies numbness, weakness.  Pain does not radiate.  No fevers or chills.  He is not on blood thinners.  His tetanus is up-to-date.  He is right-hand dominant.  The history is provided by the patient.    History reviewed. No pertinent past medical history.  There are no active problems to display for this patient.   Past Surgical History:  Procedure Laterality Date  . HERNIA REPAIR    . TONSILLECTOMY          Home Medications    Prior to Admission medications   Medication Sig Start Date End Date Taking? Authorizing Provider  albuterol (PROVENTIL HFA;VENTOLIN HFA) 108 (90 BASE) MCG/ACT inhaler Inhale 2 puffs into the lungs every 4 (four) hours as needed for wheezing or shortness of breath. 01/26/15  Yes Toy Cookey, MD  ibuprofen (ADVIL,MOTRIN) 200 MG tablet Take 800 mg by mouth daily.   Yes [provider]  methocarbamol (ROBAXIN) 500 MG tablet Take 1 tablet (500 mg total) by mouth 2 (two) times daily as needed for muscle spasms. Patient not taking: Reported on 03/18/2018 07/07/15   Everlene Farrier, PA-C  naproxen (NAPROSYN) 250 MG tablet Take 1 tablet (250 mg total) by mouth 2 (two) times daily  with a meal. Patient not taking: Reported on 03/18/2018 07/07/15   Everlene Farrier, PA-C    Family History Family History  Problem Relation Age of Onset  . Cancer Mother   . Stroke Father     Social History Social History   Tobacco Use  . Smoking status: Current Every Day Smoker    Packs/day: 0.50    Types: Cigarettes  . Smokeless tobacco: Never Used  Substance Use Topics  . Alcohol use: No  . Drug use: Yes    Types: Marijuana     Allergies   Bee venom and Penicillins   Review of Systems Review of Systems  Constitutional: Negative for fever.  Skin: Positive for wound.  Neurological: Negative for weakness and numbness.     Physical Exam Updated Vital Signs BP (!) 134/96 (BP Location: Right Arm)   Pulse (!) 104   Temp 99 F (37.2 C) (Oral)   Resp 18   SpO2 98%   Physical Exam  Constitutional: He is oriented to person, place, and time. He appears well-developed and well-nourished. No distress.  HENT:  Head: Normocephalic and atraumatic.  Eyes: Conjunctivae are normal. Right eye exhibits no discharge. Left eye exhibits no discharge.  Neck: No JVD present. No tracheal deviation present.  Cardiovascular: Normal rate and intact distal pulses.  Pulses:      Radial pulses are 2+ on the right side,  and 2+ on the left side.  Pulmonary/Chest: Effort normal.  Abdominal: He exhibits no distension.  Musculoskeletal: Normal range of motion. He exhibits tenderness. He exhibits no edema.  1 cm linear laceration noted to the palmar aspect of the left thumb along the ulnar side.  The laceration is superficial and the wound edges are well approximated.  The wound does not gape when the patient flexes or extends his digit.  There is mild tenderness surrounding the laceration.  5/5 strength of wrist and digits with flexion and extension against resistance.  Neurological: He is alert and oriented to person, place, and time.  Fluent speech, no facial droop, sensation intact to soft  touch of bilateral hands  Skin: Skin is warm and dry. No erythema.  Psychiatric: He has a normal mood and affect. His behavior is normal.  Nursing note and vitals reviewed.    ED Treatments / Results  Labs (all labs ordered are listed, but only abnormal results are displayed) Labs Reviewed - No data to display  EKG None  Radiology Dg Finger Thumb Left  Result Date: 03/18/2018 CLINICAL DATA:  Cut thumb while cutting lettuce, initial encounter EXAM: LEFT THUMB 3V COMPARISON:  None. FINDINGS: Soft tissue abnormality consistent with the laceration is noted distally. No underlying bony abnormality is seen. No radiopaque foreign body is noted. IMPRESSION: Soft tissue irregularity consistent with the given clinical history. No bony abnormality is seen. No foreign body is noted. Electronically Signed   By: Alcide CleverMark  Lukens M.D.   On: 03/18/2018 14:47    Procedures .Marland Kitchen.Laceration Repair Date/Time: 03/18/2018 4:30 PM Performed by: Jeanie SewerFawze, Chamia Schmutz A, PA-C Authorized by: Jeanie SewerFawze, Nicholad Kautzman A, PA-C   Consent:    Consent obtained:  Verbal   Consent given by:  Patient   Risks discussed:  Infection, pain, poor cosmetic result and poor wound healing   Alternatives discussed:  No treatment Anesthesia (see MAR for exact dosages):    Anesthesia method:  None Laceration details:    Location:  Finger   Finger location:  L thumb   Length (cm):  1   Depth (mm):  1 Repair type:    Repair type:  Simple Pre-procedure details:    Preparation:  Patient was prepped and draped in usual sterile fashion and imaging obtained to evaluate for foreign bodies Exploration:    Hemostasis achieved with:  Direct pressure   Wound exploration: wound explored through full range of motion and entire depth of wound probed and visualized     Wound extent: no underlying fracture noted     Contaminated: no   Treatment:    Area cleansed with:  Shur-Clens and saline   Amount of cleaning:  Extensive   Irrigation solution:  Sterile  saline   Irrigation method:  Pressure wash   Visualized foreign bodies/material removed: no   Skin repair:    Repair method:  Tissue adhesive Approximation:    Approximation:  Close Post-procedure details:    Dressing:  Splint for protection   Patient tolerance of procedure:  Tolerated well, no immediate complications   (including critical care time)  Medications Ordered in ED Medications - No data to display   Initial Impression / Assessment and Plan / ED Course  I have reviewed the triage vital signs and the nursing notes.  Pertinent labs & imaging results that were available during my care of the patient were reviewed by me and considered in my medical decision making (see chart for details).     Patient presents  with superficial laceration to the distal aspect of the left thumb.  Bleeding is controlled.  He is neurovascularly intact.  He was mildly tachycardic initially with resolution on reevaluation.  Radiographs reviewed by me show no acute osseous abnormality, no retained foreign body.  His tetanus is up-to-date.  Pressure irrigation performed. Wound explored and base of wound visualized in a bloodless field without evidence of foreign body.  Laceration occurred < 8 hours prior to repair which was well tolerated.  Wound was amenable to repair via Dermabond.  Splint was applied for protection.  Pt has  no comorbidities to effect normal wound healing. Pt discharged  without antibiotics.  Discussed wound care with Dermabond.  Discussed strict ED return precautions. Pt verbalized understanding of and agreement with plan and is safe for discharge home at this time.   Final Clinical Impressions(s) / ED Diagnoses   Final diagnoses:  Laceration of left thumb without foreign body without damage to nail, initial encounter    ED Discharge Orders    None       Bennye Alm 03/18/18 2208    Mancel Bale, MD 03/19/18 1553

## 2018-03-18 NOTE — Discharge Instructions (Signed)
1. Medications: Alternate 600 mg of ibuprofen and [phone] mg of Tylenol every 3 hours as needed for pain. Do not exceed 4000 mg of Tylenol daily.  Take ibuprofen with food to avoid upset stomach issues. 2. Treatment: ice for swelling, keep wound clean and keep bandage dry, do not submerge in water for 24 hours.  Do not apply any antibiotic ointment to the Dermabond as this will break down the glue.  I have attached further information with regards to wound care using Dermabond. 3. Follow Up: Return to the emergency department if any concerning signs or symptoms develop such as high fevers, redness, drainage of pus from the wound, or swelling.   WOUND CARE  Keep area clean and dry for 24 hours. Do not remove bandage, if applied.  After 24 hours, remove bandage and wash wound gently with mild soap and warm water. Reapply a new bandage after cleaning wound, if directed.   Continue daily cleansing with soap and water until stitches/staples are removed.  Do not apply any ointments or creams to the wound while stitches/staples are in place, as this may cause delayed healing. Return if you experience any of the following signs of infection: Swelling, redness, pus drainage, streaking, fever >101.0 F  Return if you experience excessive bleeding that does not stop after 15-20 minutes of constant, firm pressure.

## 2018-03-18 NOTE — ED Triage Notes (Signed)
Pt at work today.  Lacerated left thumb with knife while cutting lettuce.  Bleeding controlled.  Sensation present in tip of finger.  Up to date on tetanus.

## 2018-05-24 ENCOUNTER — Other Ambulatory Visit: Payer: Self-pay

## 2018-05-24 ENCOUNTER — Encounter (HOSPITAL_COMMUNITY): Payer: Self-pay

## 2018-05-24 ENCOUNTER — Emergency Department (HOSPITAL_COMMUNITY)
Admission: EM | Admit: 2018-05-24 | Discharge: 2018-05-24 | Disposition: A | Discharge status: Home / Self Care | Payer: Self-pay | Attending: Emergency Medicine | Admitting: Emergency Medicine

## 2018-05-24 DIAGNOSIS — F1290 Cannabis use, unspecified, uncomplicated: Secondary | ICD-10-CM | POA: Insufficient documentation

## 2018-05-24 DIAGNOSIS — K0889 Other specified disorders of teeth and supporting structures: Secondary | ICD-10-CM | POA: Insufficient documentation

## 2018-05-24 DIAGNOSIS — F17210 Nicotine dependence, cigarettes, uncomplicated: Secondary | ICD-10-CM | POA: Insufficient documentation

## 2018-05-24 DIAGNOSIS — K029 Dental caries, unspecified: Secondary | ICD-10-CM | POA: Insufficient documentation

## 2018-05-24 MED ORDER — LIDOCAINE VISCOUS HCL 2 % MT SOLN: 15.0000 mL | OROMUCOSAL | 0 refills | Status: AC | PRN

## 2018-05-24 MED ORDER — NAPROXEN 500 MG PO TABS: 500.0000 mg | ORAL_TABLET | Freq: Two times a day (BID) | ORAL | 0 refills | Status: AC

## 2018-05-24 MED ORDER — CLINDAMYCIN HCL 300 MG PO CAPS: 300.0000 mg | ORAL_CAPSULE | Freq: Three times a day (TID) | ORAL | 0 refills | Status: AC

## 2018-05-24 MED ORDER — IBUPROFEN 200 MG PO TABS
600.0000 mg | ORAL_TABLET | Freq: Once | ORAL | Status: AC
  Administered 2018-05-24: 600 mg via ORAL
  Filled 2018-05-24: qty 3

## 2018-05-24 NOTE — ED Provider Notes (Signed)
[facility]-EMERGENCY DEPT Provider Note   CSN: [id number] Arrival date & time: 05/24/18  [id number]     History   Chief Complaint Chief Complaint  Patient presents with  . Dental Pain    HPI Bradley Moreno is a 37 y.o. male with no significant past medical history presents emergency department today for right upper dental pain x3 days.  Patient reports that he was eating 3 days ago when he felt a crack in his back, upper right tooth.  He notes that he has had pain since that time.  He reports that he has poor dentition and has not seen a dentist in many years.  He does not currently have a dentist.  He reports he has been taking 1000mg  of Tylenol 1-2 times per day for the pain with mild to moderate relief. He notes palpation makes his symptoms worse. Denies fever, chills, voice change, inability to control secretions, nausea/vomiting, facial swelling, dysphagia, odynophagia, drainage or facial trauma   HPI  History reviewed. No pertinent past medical history.  There are no active problems to display for this patient.   Past Surgical History:  Procedure Laterality Date  . HERNIA REPAIR    . TONSILLECTOMY          Home Medications    Prior to Admission medications   Medication Sig Start Date End Date Taking? Authorizing Provider  albuterol (PROVENTIL HFA;VENTOLIN HFA) 108 (90 BASE) MCG/ACT inhaler Inhale 2 puffs into the lungs every 4 (four) hours as needed for wheezing or shortness of breath. 01/26/15   Toy Cookeyocherty, Megan, MD  ibuprofen (ADVIL,MOTRIN) 200 MG tablet Take 800 mg by mouth daily.    [provider]  methocarbamol (ROBAXIN) 500 MG tablet Take 1 tablet (500 mg total) by mouth 2 (two) times daily as needed for muscle spasms. Patient not taking: Reported on 03/18/2018 07/07/15   Everlene Farrieransie, William, PA-C  naproxen (NAPROSYN) 250 MG tablet Take 1 tablet (250 mg total) by mouth 2 (two) times daily with a meal. Patient not taking: Reported on 03/18/2018 07/07/15    Everlene Farrieransie, William, PA-C    Family History Family History  Problem Relation Age of Onset  . Cancer Mother   . Stroke Father     Social History Social History   Tobacco Use  . Smoking status: Current Every Day Smoker    Packs/day: 0.50    Types: Cigarettes  . Smokeless tobacco: Never Used  Substance Use Topics  . Alcohol use: Yes    Comment: occasionally  . Drug use: Yes    Types: Marijuana    Comment: occasionally     Allergies   Bee venom and Penicillins   Review of Systems Review of Systems  Constitutional: Negative for fever.  HENT: Positive for dental problem. Negative for drooling, sinus pressure, sinus pain, sore throat, trouble swallowing and voice change.   Gastrointestinal: Negative for nausea and vomiting.  Musculoskeletal: Negative for neck pain and neck stiffness.  All other systems reviewed and are negative.    Physical Exam Updated Vital Signs BP (!) 143/96   Pulse 81   Temp 98 F (36.7 C) (Oral)   Resp 12   Ht 5\' 5"  (1.651 m)   Wt 104.3 kg (230 lb)   SpO2 100%   BMI 38.27 kg/m   Physical Exam  Constitutional: He appears well-developed and well-nourished.  HENT:  Head: Normocephalic and atraumatic.  Right Ear: External ear normal.  Left Ear: External ear normal.  Nose: Right sinus  exhibits no maxillary sinus tenderness and no frontal sinus tenderness. Left sinus exhibits no maxillary sinus tenderness and no frontal sinus tenderness.  The patient has normal phonation and is in control of secretions. No stridor.  Midline uvula without edema. Soft palate rises symmetrically. Tonsils absent/unvisualized. Tongue protrusion is normal. No thrush. No trismus. No TMJ tenderness. No creptius on neck palpation.  Patient with very poor dentition.  He is noted to have receding gumline as well as multiple cavities.  He has tenderness percussion of the right upper tooth as indicated in diagram.  No gingival erythema or fluctuance noted.  No floor edema.  No  facial or neck swelling.  No periorbital edema.  Mucus membranes moist.   Eyes: Conjunctivae are normal. Right eye exhibits no discharge. Left eye exhibits no discharge. No scleral icterus.  Neck:  No nuchal rigidity or meningismus  Pulmonary/Chest: Effort normal. No respiratory distress.  Neurological: He is alert.  Skin: Skin is warm and dry. Capillary refill takes less than 2 seconds. No pallor.  No vesicular-like rash.  Psychiatric: He has a normal mood and affect.  Nursing note and vitals reviewed.    ED Treatments / Results  Labs (all labs ordered are listed, but only abnormal results are displayed) Labs Reviewed - No data to display  EKG None  Radiology No results found.  Procedures Procedures (including critical care time)  Medications Ordered in ED Medications  ibuprofen (ADVIL,MOTRIN) tablet 600 mg (has no administration in time range)     Initial Impression / Assessment and Plan / ED Course  I have reviewed the triage vital signs and the nursing notes.  Pertinent labs & imaging results that were available during my care of the patient were reviewed by me and considered in my medical decision making (see chart for details).     Patient with toothache.  No gross abscess.  Exam unconcerning for Ludwig's angina or spread of infection.  Will treat with clinda (pcn allergy), lidocaine mouthwash and anti-inflammatories medicine.  Urged patient to follow-up with dentist. Dental resources and dentist on call information provided.  Return precautions discussed.  Patient appears safe discharge.   Final Clinical Impressions(s) / ED Diagnoses   Final diagnoses:  Pain, dental    ED Discharge Orders        Ordered    clindamycin (CLEOCIN) 300 MG capsule  3 times daily     05/24/18 0914    naproxen (NAPROSYN) 500 MG tablet  2 times daily     05/24/18 0914    lidocaine (XYLOCAINE) 2 % solution  As needed     05/24/18 0914       Jacinto Halim, PA-C 05/24/18  0915    Mancel Bale, MD 05/26/18 1003

## 2018-05-24 NOTE — Discharge Instructions (Addendum)
Please read and follow all provided instructions.  Your diagnoses today include:  1. Pain, dental     The exam and treatment you received today has been provided on an emergency basis only. This is not a substitute for complete medical or dental care. This problem will not resolve on its own without the care of a dentist.  Tests performed today include: Vital signs. See below for your results today.   Medications prescribed:   Take any prescribed medications only as directed. Use ibuprofen or naproxen for pain. Use the viscous lidocaine for mouth pain. Swish with the lidocaine and spit it out. Do not swallow it.  Please take all of your antibiotics until finished!   You may develop abdominal discomfort or diarrhea from the antibiotic.  You may help offset this with probiotics which you can buy or get in yogurt. Do not eat or take the probiotics until 2 hours after your antibiotic. Do not take your medicine if develop an itchy rash, swelling in your mouth or lips, or difficulty breathing.   Home care instructions:  Follow any educational materials contained in this packet.  Follow-up instructions: Please follow-up with your dentist for further evaluation of your symptoms.   Dental Assistance: See handout for dental referrals  Return instructions:  Please return to the Emergency Department if you experience worsening symptoms. Please return if you develop a fever, you develop more swelling in your face or neck, you have trouble breathing or swallowing food. Please return if you have any other emergent concerns.  Additional Information:  Your vital signs today were: BP (!) 143/96    Pulse 81    Temp 98 F (36.7 C) (Oral)    Resp 12    Ht 5\' 5"  (1.651 m)    Wt 104.3 kg (230 lb)    SpO2 100%    BMI 38.27 kg/m  If your blood pressure (BP) was elevated above 135/85 this visit, please have this repeated by your doctor within one month. --------------

## 2018-05-24 NOTE — ED Triage Notes (Signed)
Patient c/o right upper dental pain x 3 days.

## 2022-06-01 ENCOUNTER — Other Ambulatory Visit (HOSPITAL_COMMUNITY)
Admission: AD | Admit: 2022-06-01 | Discharge: 2022-06-01 | Disposition: A | Discharge status: Home / Self Care | Payer: Self-pay | Source: Ambulatory Visit | Attending: Cardiology | Admitting: Cardiology

## 2022-06-01 DIAGNOSIS — E785 Hyperlipidemia, unspecified: Secondary | ICD-10-CM | POA: Insufficient documentation

## 2022-06-01 LAB — BASIC METABOLIC PANEL
Anion gap: 6 (ref 5–15)
BUN: 10 mg/dL (ref 6–20)
CO2: 20 mmol/L — ABNORMAL LOW (ref 22–32)
Calcium: 8.7 mg/dL — ABNORMAL LOW (ref 8.9–10.3)
Chloride: 112 mmol/L — ABNORMAL HIGH (ref 98–111)
Creatinine, Ser: 0.64 mg/dL (ref 0.61–1.24)
GFR, Estimated: >60 mL/min (ref >60)
Glucose, Bld: 114 mg/dL — ABNORMAL HIGH (ref 70–99)
Potassium: 4.1 mmol/L (ref 3.5–5.1)
Sodium: 138 mmol/L (ref 135–145)

## 2022-06-01 LAB — HEPATIC FUNCTION PANEL
ALT: 28 U/L (ref 0–44)
AST: 21 U/L (ref 15–41)
Albumin: 3.5 g/dL (ref 3.5–5.0)
Alkaline Phosphatase: 72 U/L (ref 38–126)
Bilirubin, Direct: <0.1 mg/dL (ref 0.0–0.2)
Total Bilirubin: 0.2 mg/dL — ABNORMAL LOW (ref 0.3–1.2)
Total Protein: 7.6 g/dL (ref 6.5–8.1)

## 2022-06-01 LAB — LIPID PANEL
Cholesterol: 155 mg/dL (ref 0–200)
HDL: 45 mg/dL (ref >40)
LDL Cholesterol: 102 mg/dL — ABNORMAL HIGH (ref 0–99)
Total CHOL/HDL Ratio: 3.4 RATIO
Triglycerides: 38 mg/dL (ref <150)
VLDL: 8 mg/dL (ref 0–40)

## 2023-04-27 ENCOUNTER — Encounter (HOSPITAL_COMMUNITY): Payer: Self-pay | Admitting: Emergency Medicine

## 2023-04-27 ENCOUNTER — Emergency Department (HOSPITAL_COMMUNITY)
Admission: EM | Admit: 2023-04-27 | Discharge: 2023-04-27 | Disposition: A | Discharge status: Home / Self Care | Payer: Self-pay | Attending: Emergency Medicine | Admitting: Emergency Medicine

## 2023-04-27 ENCOUNTER — Other Ambulatory Visit: Payer: Self-pay

## 2023-04-27 DIAGNOSIS — G5701 Lesion of sciatic nerve, right lower limb: Secondary | ICD-10-CM | POA: Insufficient documentation

## 2023-04-27 MED ORDER — METHOCARBAMOL 500 MG PO TABS: 500.0000 mg | ORAL_TABLET | Freq: Two times a day (BID) | ORAL | 0 refills | Status: AC | PRN

## 2023-04-27 MED ORDER — OXYCODONE HCL 5 MG PO TABS
5.0000 mg | ORAL_TABLET | Freq: Once | ORAL | Status: AC
  Administered 2023-04-27: 5 mg via ORAL
  Filled 2023-04-27: qty 1

## 2023-04-27 MED ORDER — PREDNISONE 20 MG PO TABS: ORAL_TABLET | ORAL | 0 refills | Status: AC

## 2023-04-27 MED ORDER — GABAPENTIN 300 MG PO CAPS: ORAL_CAPSULE | ORAL | 0 refills | Status: AC

## 2023-04-27 NOTE — ED Provider Notes (Signed)
[place] EMERGENCY DEPARTMENT AT [facility] Provider Note   CSN: [id number] Arrival date & time: 04/27/23  1209     History  Chief Complaint  Patient presents with   Leg Pain    Bradley Moreno is a 42 y.o. male presents to the ED complaining of pain in his right leg that begins at the right buttock and shoots down.  Patient states that this pain has been going on for the past 3 months.  He reports he went to his PCP yesterday who advised him to go to the ED for an emergent MRI.  Patient states he does have relief when his right leg is wrapped with an Ace bandage and he also wears compression stockings.  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  Denies fall or trauma.  He has been taking 2400 mg of ibuprofen daily without much relief of symptoms.  He states that he does have tingling in his right leg and slightly decrease sensation.  Denies loss of bladder or bowel control, weakness in his lower extremities, urinary retention, headache, fever, IV drug use, previous spinal surgeries.         Home Medications Prior to Admission medications   Medication Sig Start Date End Date Taking? Authorizing Provider  gabapentin (NEURONTIN) 300 MG capsule Take 1 capsule (300 mg total) by mouth at bedtime for 1 day, THEN 1 capsule (300 mg total) 2 (two) times daily for 2 days, THEN 1 capsule (300 mg total) 3 (three) times daily for 7 days. 04/27/23 05/07/23 Yes Dailee Manalang R, PA-C  predniSONE (DELTASONE) 20 MG tablet Take 3 tablets (60 mg total) by mouth daily for 5 days, THEN 2 tablets (40 mg total) daily for 5 days, THEN 1 tablet (20 mg total) daily for 5 days. 04/27/23 05/12/23 Yes Pierce Barocio R, PA-C  albuterol (PROVENTIL HFA;VENTOLIN HFA) 108 (90 BASE) MCG/ACT inhaler Inhale 2 puffs into the lungs every 4 (four) hours as needed for wheezing or shortness of breath. 01/26/15   Toy Cookey, MD  ibuprofen  (ADVIL,MOTRIN) 200 MG tablet Take 800 mg by mouth daily.    [provider]  lidocaine (XYLOCAINE) 2 % solution Use as directed 15 mLs in the mouth or throat as needed for mouth pain. 05/24/18   Maczis, Elmer Sow, PA-C  methocarbamol (ROBAXIN) 500 MG tablet Take 1 tablet (500 mg total) by mouth 2 (two) times daily as needed for muscle spasms. 04/27/23   Jaelon Gatley R, PA-C  naproxen (NAPROSYN) 500 MG tablet Take 1 tablet (500 mg total) by mouth 2 (two) times daily. 05/24/18   Maczis, Elmer Sow, PA-C      Allergies    Bee venom and Penicillins    Review of Systems   Review of Systems  Constitutional:  Negative for fever.  Musculoskeletal:  Positive for arthralgias and back pain. Negative for gait problem.  Neurological:  Positive for numbness (Right leg). Negative for weakness and headaches.    Physical Exam Updated Vital Signs BP 129/82 (BP Location: Right Arm)   Pulse 70   Temp 97.9 F (36.6 C) (Oral)   Resp 18   Ht 5\' 5"  (1.651 m)   Wt 104 kg   SpO2 97%   BMI 38.15 kg/m  Physical Exam Vitals and nursing note reviewed.  Constitutional:      General: He is not in acute distress.  Appearance: Normal appearance. He is not ill-appearing or diaphoretic.  Cardiovascular:     Rate and Rhythm: Normal rate and regular rhythm.     Pulses:          Dorsalis pedis pulses are 2+ on the right side and 2+ on the left side.  Pulmonary:     Effort: Pulmonary effort is normal.  Musculoskeletal:     Thoracic back: Normal.     Lumbar back: Tenderness and bony tenderness present. No signs of trauma or spasms. Normal range of motion. Positive right straight leg raise test. Negative left straight leg raise test.     Comments: Tenderness to palpation of right buttock over the piriformis muscle.   Skin:    General: Skin is warm and dry.     Capillary Refill: Capillary refill takes less than 2 seconds.  Neurological:     Mental Status: He is alert. Mental status is at baseline.      GCS: GCS eye subscore is 4. GCS verbal subscore is 5. GCS motor subscore is 6.     Sensory: Sensory deficit present.     Motor: Motor function is intact. No weakness.     Coordination: Coordination is intact.     Gait: Gait is intact.     Comments: Patient with subjectively decreased sensation to entire right leg when compared to right.  Skin is painful to light touch as well.  5/5 strength in BLE.    Psychiatric:        Mood and Affect: Mood normal.        Behavior: Behavior normal.     ED Results / Procedures / Treatments   Labs (all labs ordered are listed, but only abnormal results are displayed) Labs Reviewed - No data to display  EKG None  Radiology No results found.  Procedures Procedures    Medications Ordered in ED Medications  oxyCODONE (Oxy IR/ROXICODONE) immediate release tablet 5 mg (5 mg Oral Given 04/27/23 1336)    ED Course/ Medical Decision Making/ A&P                             Medical Decision Making  This patient presents to the ED with chief complaint(s) of right leg and buttock pain with non-contributory past medical history.  The complaint involves an extensive differential diagnosis and also carries with it a high risk of complications and morbidity.    The differential diagnosis includes acute low back pain with sciatica, degenerative disc disease, herniated disc, neuropathy; low suspicion for cauda equina due based on patient presentation    Initial Assessment:   Exam significant for mild tenderness to palpation of lumbar spine from L4-S1.  No step offs or bony deformities.  Tenderness to palpation of right buttock which causes radiation of pain and tingling down his right leg.  Subjectively reduced sensation to light touch of right lower extremity from the knee down.  Skin is also painful to light touch.  No lower leg edema.  DP pulses are 2+ bilaterally.  Gait is intact.  Patient able to walk without assistance.  5/5 strength in BLE.     Treatment and Reassessment: Patient given oral pain medicine with improvement in his symptoms.  I do not feel that patient requires emergent MRI at this time.  He does not have evidence of cauda equina.  Discussed this with patient who is in agreement with holding off on imaging for now.  Disposition:   Will refer patient to orthopedics for follow up and possible advanced imaging.  Will send patient home on course of steroids, muscle relaxant, and gabapentin to help with low back pain with sciatica.    The patient has been appropriately medically screened and/or stabilized in the ED. I have low suspicion for any other emergent medical condition which would require further screening, evaluation or treatment in the ED or require inpatient management. At time of discharge the patient is hemodynamically stable and in no acute distress. I have discussed work-up results and diagnosis with patient and answered all questions. Patient is agreeable with discharge plan. We discussed strict return precautions for returning to the emergency department and they verbalized understanding.            Final Clinical Impression(s) / ED Diagnoses Final diagnoses:  Piriformis syndrome of right side    Rx / DC Orders ED Discharge Orders          Ordered    methocarbamol (ROBAXIN) 500 MG tablet  2 times daily PRN        04/27/23 1406    predniSONE (DELTASONE) 20 MG tablet  Daily        04/27/23 1406    gabapentin (NEURONTIN) 300 MG capsule  Multiple Frequencies        04/27/23 1406              Lenard Simmer, PA-C 04/27/23 1407    Rondel Baton, MD 04/28/23 [phone]

## 2023-04-27 NOTE — ED Triage Notes (Signed)
Pt reports about 3 months ago, he began having right leg pain. Pain at buttocks that shoots down. Relief when he has it wrapped with ace bandage. Denies any trauma. 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 PCP would like him to have MRI.

## 2023-04-27 NOTE — Discharge Instructions (Addendum)
Thank you for allowing me to be a part of your care today.  You were evaluated in the ED for right leg pain.   I suspect your pain is coming from your sciatic nerve which is likely irritated either from your lumbar spine or your piriformis muscle (a muscle in your buttock that can press on the sciatic nerve).    I have provided exercises you can do to help with your pain.  If these exercises make your symptoms worse, please discontinue them.   I have sent over prescriptions to your pharmacy to help with your symptoms.  I have sent a short course of steroids, muscle relaxant, and gabapentin.  Please take these as prescribed.  Do not operate heavy machinery, drive, or perform other tasks that require you to be completely alert while taking the muscle relaxant.  Do not mix alcohol with these medications.   Do not take ibuprofen or Aleve while taking the prednisone.  This can increase your risk of bleeding in the stomach.    Please schedule a follow up appointment with the orthopedic provider.   Return to the ED if you develop sudden worsening of your symptoms, are unable to walk, have loss of bladder or bowel control, or if you have any new concerns.

## 2023-07-11 ENCOUNTER — Ambulatory Visit: Payer: Self-pay | Admitting: Physical Medicine and Rehabilitation

## 2023-08-23 ENCOUNTER — Other Ambulatory Visit (INDEPENDENT_AMBULATORY_CARE_PROVIDER_SITE_OTHER): Payer: Self-pay

## 2023-08-23 ENCOUNTER — Encounter: Payer: Self-pay | Admitting: Physical Medicine and Rehabilitation

## 2023-08-23 ENCOUNTER — Ambulatory Visit (INDEPENDENT_AMBULATORY_CARE_PROVIDER_SITE_OTHER): Payer: No Typology Code available for payment source | Admitting: Physical Medicine and Rehabilitation

## 2023-08-23 DIAGNOSIS — G8929 Other chronic pain: Secondary | ICD-10-CM | POA: Diagnosis not present

## 2023-08-23 DIAGNOSIS — M5441 Lumbago with sciatica, right side: Secondary | ICD-10-CM

## 2023-08-23 DIAGNOSIS — M5416 Radiculopathy, lumbar region: Secondary | ICD-10-CM

## 2023-08-23 DIAGNOSIS — M7918 Myalgia, other site: Secondary | ICD-10-CM | POA: Diagnosis not present

## 2023-08-23 MED ORDER — MELOXICAM 15 MG PO TABS: 15.0000 mg | ORAL_TABLET | Freq: Every day | ORAL | 0 refills | Status: AC

## 2023-08-23 NOTE — Progress Notes (Unsigned)
[name] - 42 y.o. male MRN [id number]  Date of birth: 1980-11-01  Office Visit Note: Visit Date: 08/23/2023 PCP: Donato Schultz, FNP Referred by: Donato Schultz, FNP  Subjective: Chief Complaint  Patient presents with   Lower Back - Pain   HPI: Bradley Moreno is a 42 y.o. male who comes in today per the request of Roselyn Reef, NP for evaluation of chronic, worsening and severe right sided buttock radiating down lateral leg to foot. Pain ongoing about 8 months, worsens with movement, activity and laying down to sleep. He describes as sharp, burning and stabbing sensation, currently rates as 7 out of 10. Also reports numbness to right lateral calf region. Some relief of pain with home exercise regimen, rest and use of medications. Some relief of pain with Prednisone. Naproxen and Robaxin. He was evaluated in the emergency department on 04/27/2023 and was diagnosed with Piriformis Syndrome. No history of formal physical therapy. No prior imaging of lumbar spine. No history of lumbar surgery/injections. Patient working full time as Designer, fashion/clothing at [employer] Patient appears uncomfortable during our visit today, moves frequently from sitting to standing position. Patient denies focal weakness. Patient denies recent trauma or falls.    Oswestry Disability Index Score 38% 10 to 20 (40%) moderate disability: The patient experiences more pain and difficulty with sitting, lifting and standing. Travel and social life are more difficult, and they may be disabled from work. Personal care, sexual activity and sleeping are not grossly affected, and the patient can usually be managed by conservative means.  Review of Systems  Musculoskeletal:  Positive for back pain.  Neurological:  Positive for tingling. Negative for focal weakness and weakness.  All other systems reviewed and are negative.  Otherwise per HPI.  Assessment & Plan: Visit Diagnoses:    ICD-10-CM   1. Chronic right-sided low back  pain with right-sided sciatica  M54.41 XR Lumbar Spine Complete   G89.29 MR LUMBAR SPINE WO CONTRAST    2. Lumbar radiculopathy  M54.16 XR Lumbar Spine Complete    MR LUMBAR SPINE WO CONTRAST    3. Right buttock pain  M79.18        Plan: Findings:  Chronic, worsening and severe right sided buttock pain radiating down lateral leg to foot. Patient continues to have severe pain despite good conservative therapies such as home exercise regimen, rest and use of medications. Patients clinical presentation and exam are consistent with L5 nerve pattern. I obtained lumbar radiographs in the office today that show transitional anatomy, normal anatomical alignment, no pars defects. Next step is to obtain lumbar MRI imaging. Depending on results of MRI imaging we discussed possibility of performing lumbar epidural steroid injection. Would also consider short course of formal physical therapy as I do feel he would benefit from core strengthening. We discussed medication management today, I prescribed Meloxicam, he can continue with Robaxin as needed. Patient has no questions at this time, we will see him back for lumbar MRI review. No red flag symptoms noted upon exam today.     Meds & Orders:  Meds ordered this encounter  Medications   meloxicam (MOBIC) 15 MG tablet    Sig: Take 1 tablet (15 mg total) by mouth daily.    Dispense:  30 tablet    Refill:  0    Orders Placed This Encounter  Procedures   XR Lumbar Spine Complete   MR LUMBAR SPINE WO CONTRAST    Follow-up: Return lumbar  MRI review.   Procedures: No procedures performed      Clinical History: No specialty comments available.   He reports that he has been smoking cigarettes. He has never used smokeless tobacco. No results for input(s): "HGBA1C", "LABURIC" in the last 8760 hours.  Objective:  VS:  HT:    WT:   BMI:     BP:   HR: bpm  TEMP: ( )  RESP:  Physical Exam Vitals and nursing note reviewed.  HENT:     Head:  Normocephalic and atraumatic.     Right Ear: External ear normal.     Left Ear: External ear normal.     Nose: Nose normal.     Mouth/Throat:     Mouth: Mucous membranes are moist.  Eyes:     Extraocular Movements: Extraocular movements intact.  Cardiovascular:     Rate and Rhythm: Normal rate.     Pulses: Normal pulses.  Pulmonary:     Effort: Pulmonary effort is normal.  Abdominal:     General: Abdomen is flat. There is distension.  Musculoskeletal:        General: Tenderness present.     Cervical back: Normal range of motion.     Comments: Patient rises from seated position to standing without difficulty. Good lumbar range of motion. No pain noted with facet loading. 5/5 strength noted with bilateral hip flexion, knee flexion/extension, ankle dorsiflexion/plantarflexion and EHL. No clonus noted bilaterally. No pain upon palpation of greater trochanters. No pain with internal/external rotation of bilateral hips. Sensation intact bilaterally. Dysesthesias noted to right L5 dermatome. Negative slump test bilaterally. Ambulates without aid, gait steady.     Skin:    General: Skin is warm and dry.     Capillary Refill: Capillary refill takes less than 2 seconds.  Neurological:     General: No focal deficit present.     Mental Status: He is alert and oriented to person, place, and time.  Psychiatric:        Mood and Affect: Mood normal.        Behavior: Behavior normal.     Ortho Exam  Imaging: XR Lumbar Spine Complete  Result Date: 08/23/2023 Radiographs of lumbar spine show transitional anatomy (lumbarized S1). Normal anatomical alignment, well preserved disc spacing. No pars defects. No fractures or dislocations.    Past Medical/Family/Surgical/Social History: Medications & Allergies reviewed per EMR, new medications updated. There are no problems to display for this patient.  History reviewed. No pertinent past medical history. Family History  Problem Relation Age of  Onset   Cancer Mother    Stroke Father    Past Surgical History:  Procedure Laterality Date   HERNIA REPAIR     TONSILLECTOMY     Social History   Occupational History   Not on file  Tobacco Use   Smoking status: Every Day    Current packs/day: 0.50    Types: Cigarettes   Smokeless tobacco: Never  Vaping Use   Vaping status: Never Used  Substance and Sexual Activity   Alcohol use: Yes    Comment: occasionally   Drug use: Yes    Types: Marijuana    Comment: occasionally   Sexual activity: Not on file

## 2023-08-23 NOTE — Progress Notes (Unsigned)
Functional Pain Scale - descriptive words and definitions  {CHL AMB ORTHO FUNCTIONAL PAIN SCALE:28673}  Average Pain 9  Saw PCP recently. Pain in right buttock radiates down leg. Pain is flaming, stabbing pain. No previous imaging. Works at [employer]. 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 PCP prescribed Prednisone and Robaxin. States it did not help. He is currently taking ibuprofen but does not seem to help. States the only position he has no pain is when squatting. Had a cortisone injection in the passed and Helped for 1 1/2 week.

## 2023-09-02 ENCOUNTER — Encounter: Payer: Self-pay | Admitting: Physical Medicine and Rehabilitation

## 2023-09-12 ENCOUNTER — Inpatient Hospital Stay: Admission: RE | Admit: 2023-09-12 | Payer: No Typology Code available for payment source | Source: Ambulatory Visit

## 2024-08-20 ENCOUNTER — Encounter: Payer: Self-pay | Admitting: Radiology
